# Patient Record
Sex: Female | Born: 1980 | Hispanic: No | Marital: Married | Smoking: Never smoker
Health system: Southern US, Community
[De-identification: ages and names within clinical notes are randomized; demographics above are authoritative.]

## PROBLEM LIST (undated history)

## (undated) ENCOUNTER — Inpatient Hospital Stay (HOSPITAL_COMMUNITY): Payer: Self-pay

## (undated) ENCOUNTER — Inpatient Hospital Stay (HOSPITAL_COMMUNITY): Payer: Medicaid Other

## (undated) ENCOUNTER — Emergency Department (HOSPITAL_BASED_OUTPATIENT_CLINIC_OR_DEPARTMENT_OTHER): Payer: Medicaid Other

## (undated) DIAGNOSIS — T8859XA Other complications of anesthesia, initial encounter: Secondary | ICD-10-CM

## (undated) DIAGNOSIS — K219 Gastro-esophageal reflux disease without esophagitis: Secondary | ICD-10-CM

## (undated) DIAGNOSIS — O24419 Gestational diabetes mellitus in pregnancy, unspecified control: Secondary | ICD-10-CM

## (undated) HISTORY — PX: WISDOM TOOTH EXTRACTION: SHX21

---

## 2009-01-15 ENCOUNTER — Ambulatory Visit (HOSPITAL_COMMUNITY): Admission: RE | Admit: 2009-01-15 | Discharge: 2009-01-15 | Payer: Self-pay | Admitting: Family Medicine

## 2009-02-05 ENCOUNTER — Ambulatory Visit (HOSPITAL_COMMUNITY): Admission: RE | Admit: 2009-02-05 | Discharge: 2009-02-05 | Payer: Self-pay | Admitting: Family Medicine

## 2009-02-25 ENCOUNTER — Ambulatory Visit (HOSPITAL_COMMUNITY): Admission: RE | Admit: 2009-02-25 | Discharge: 2009-02-25 | Payer: Self-pay | Admitting: Family Medicine

## 2009-07-07 ENCOUNTER — Ambulatory Visit (HOSPITAL_COMMUNITY): Admission: RE | Admit: 2009-07-07 | Discharge: 2009-07-07 | Payer: Self-pay | Admitting: Family Medicine

## 2009-07-16 ENCOUNTER — Ambulatory Visit: Payer: Self-pay | Admitting: Obstetrics and Gynecology

## 2009-07-16 ENCOUNTER — Inpatient Hospital Stay (HOSPITAL_COMMUNITY): Admission: AD | Admit: 2009-07-16 | Discharge: 2009-07-18 | Payer: Self-pay | Admitting: Family Medicine

## 2009-08-10 ENCOUNTER — Encounter: Admission: RE | Admit: 2009-08-10 | Discharge: 2009-08-10 | Payer: Self-pay | Admitting: Internal Medicine

## 2010-05-24 LAB — CBC
Hemoglobin: 12.8 g/dL (ref 12.0–15.0)
MCHC: 34.2 g/dL (ref 30.0–36.0)
RBC: 4.34 MIL/uL (ref 3.87–5.11)
WBC: 9.9 10*3/uL (ref 4.0–10.5)

## 2010-05-24 LAB — RPR: RPR Ser Ql: NONREACTIVE

## 2010-08-27 IMAGING — US US OB DETAIL+14 WK
1 series · 14 of 28 positions shown · non-contrast
Comparison: none

OBSTETRICAL ULTRASOUND:
 This ultrasound was performed in The [HOSPITAL], and the [facility] report will be stored to [REDACTED] PACS.  This report is also available in [HOSPITAL]?s accessANYware.

[Series 1: us ob detail+14 wk · 14 of 85 slices shown]
[im 4/85]
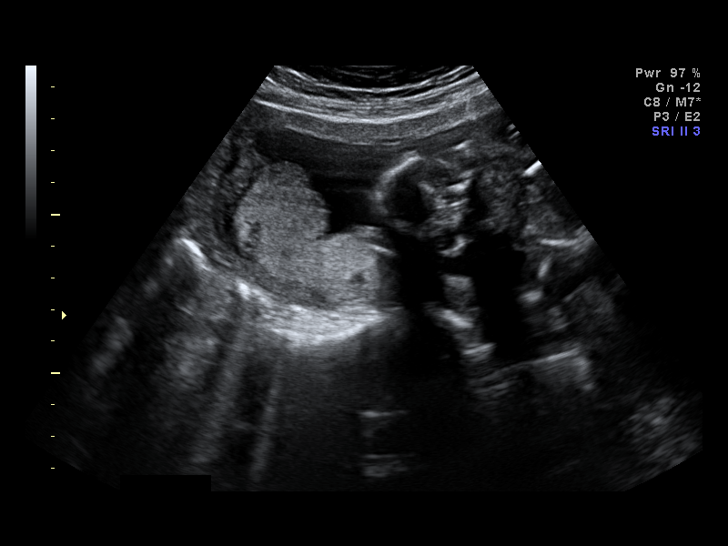
[im 10/85]
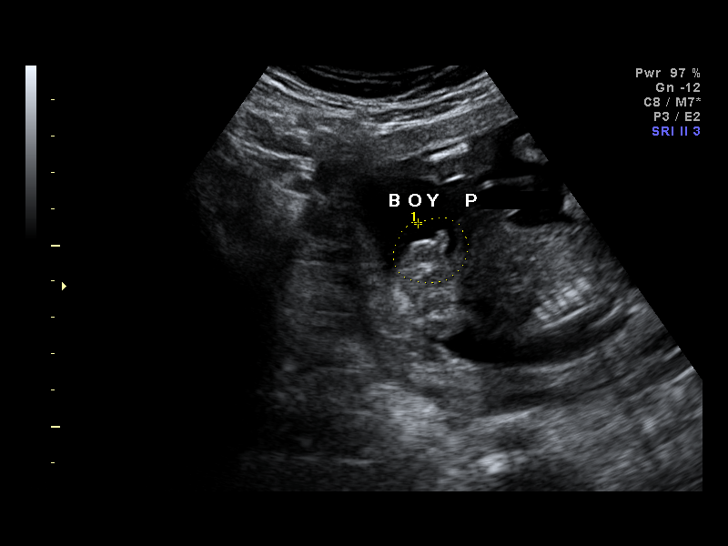
[im 16/85]
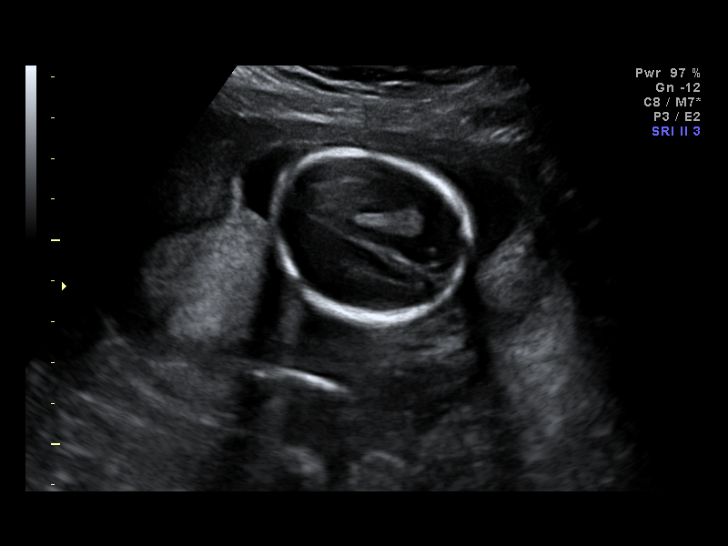
[im 22/85]
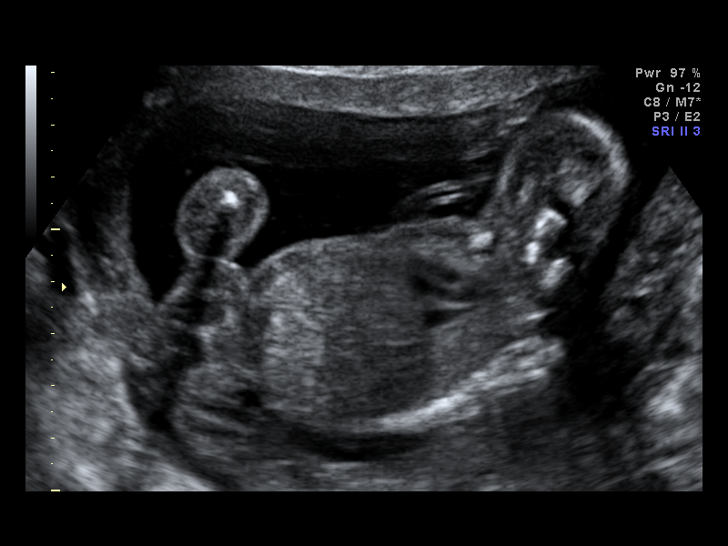
[im 29/85]
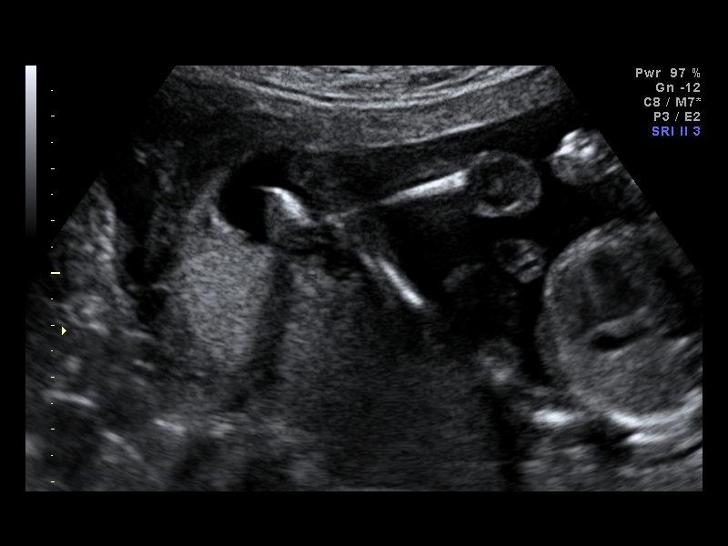
[im 35/85]
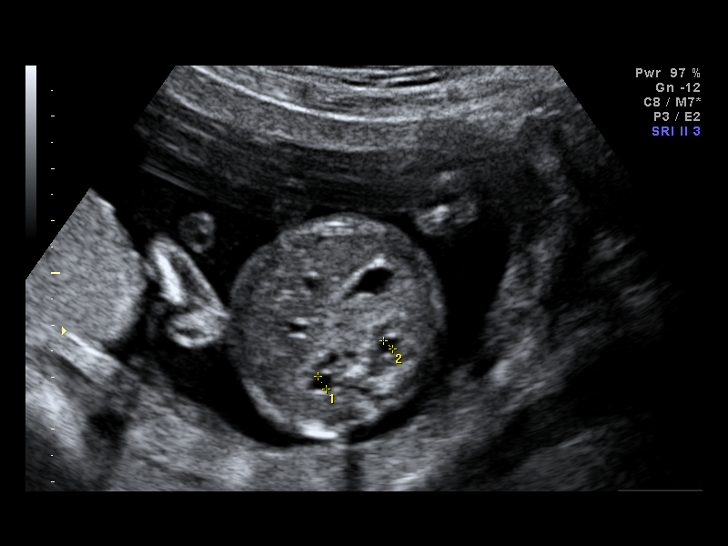
[im 41/85]
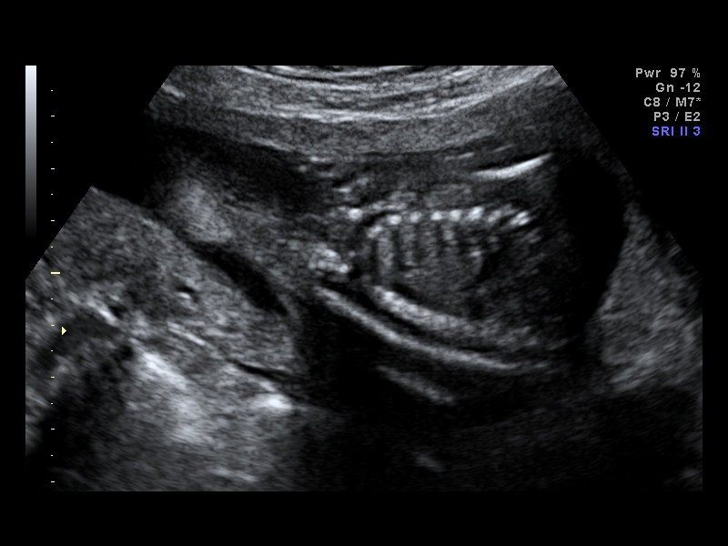
[im 47/85]
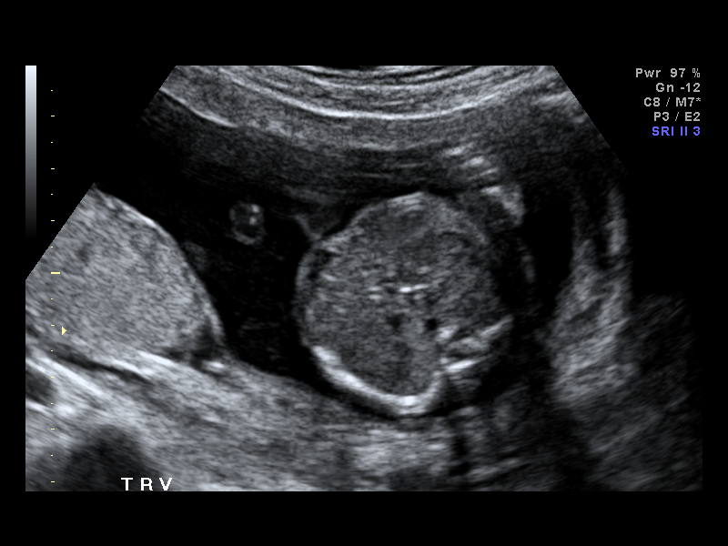
[im 53/85]
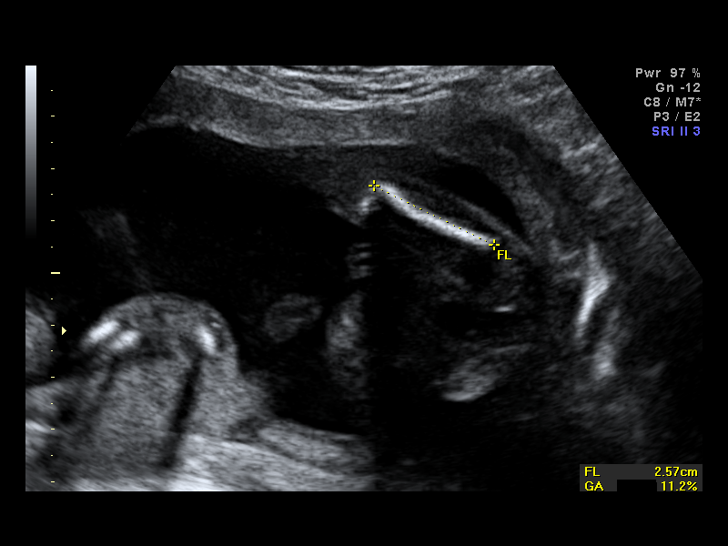
[im 60/85]
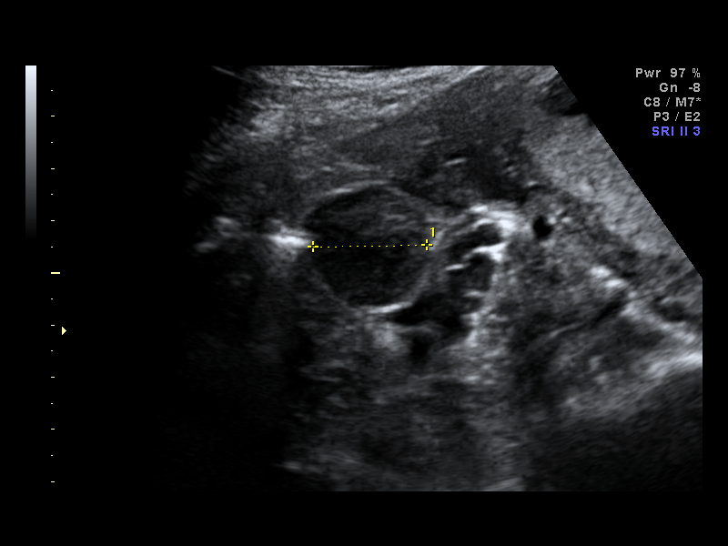
[im 66/85]
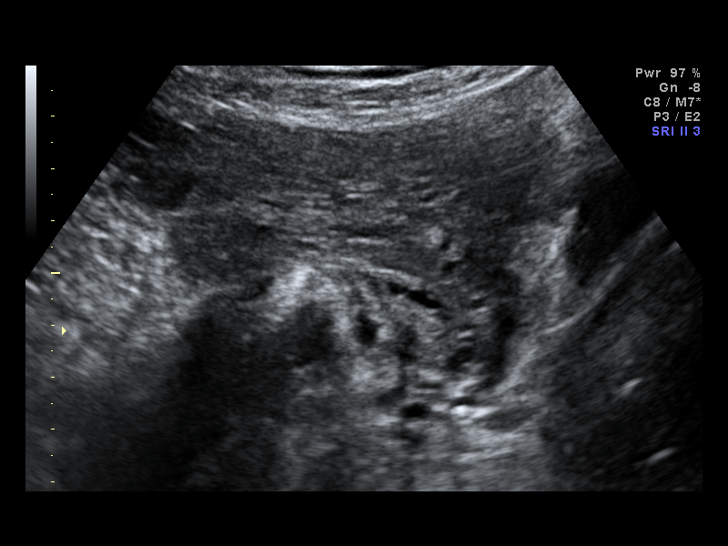
[im 72/85]
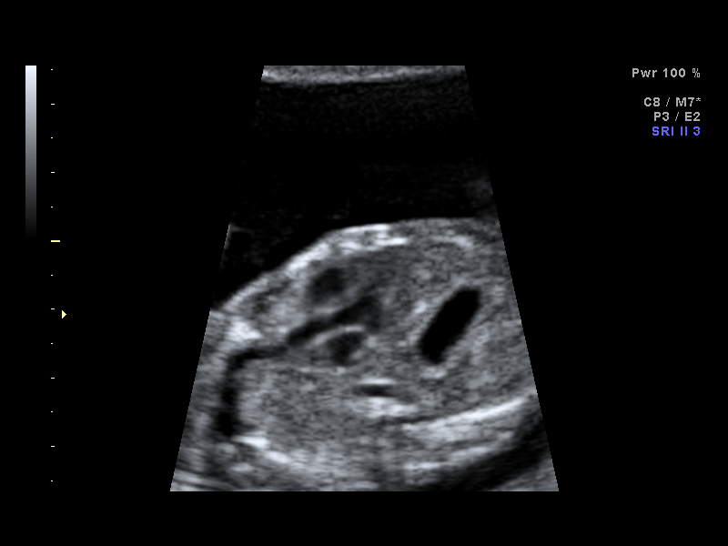
[im 78/85]
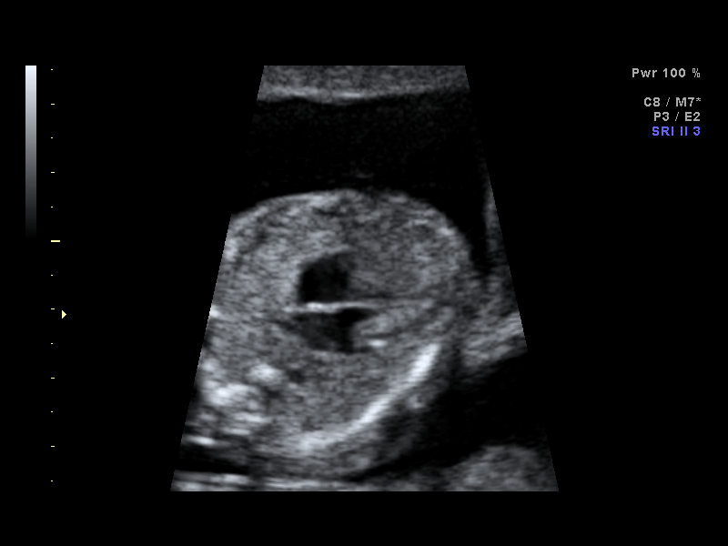
[im 85/85]
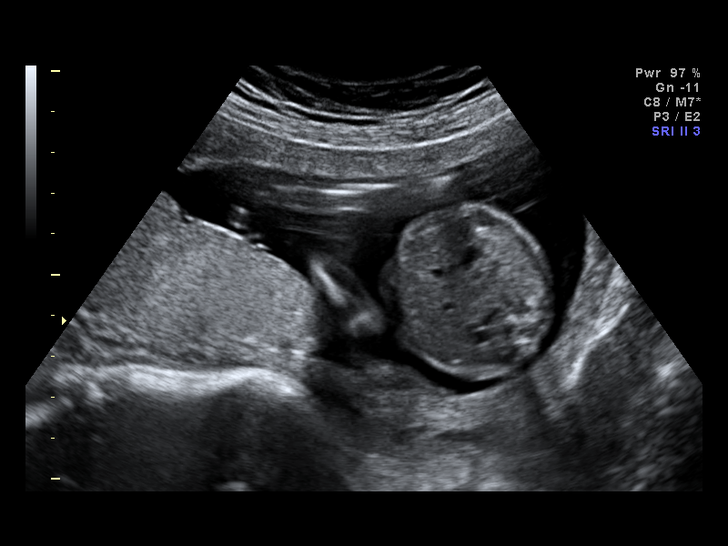

[14 of 28 positions shown; findings below may reference images not displayed]

IMPRESSION: [facility] has also been faxed to the ordering physician.

## 2011-06-17 ENCOUNTER — Ambulatory Visit (INDEPENDENT_AMBULATORY_CARE_PROVIDER_SITE_OTHER): Payer: BC Managed Care – PPO | Admitting: Physician Assistant

## 2011-06-17 VITALS — BP 113/76 | HR 80 | Temp 98.0°F | Resp 16 | Ht 62.0 in | Wt 182.6 lb

## 2011-06-17 DIAGNOSIS — R202 Paresthesia of skin: Secondary | ICD-10-CM

## 2011-06-17 DIAGNOSIS — M2550 Pain in unspecified joint: Secondary | ICD-10-CM

## 2011-06-17 DIAGNOSIS — R209 Unspecified disturbances of skin sensation: Secondary | ICD-10-CM

## 2011-06-17 LAB — POCT CBC
Granulocyte percent: 58.1 %G (ref 37–80)
HCT, POC: 38.2 % (ref 37.7–47.9)
MCV: 82.1 fL (ref 80–97)
Platelet Count, POC: 398 10*3/uL (ref 142–424)
RBC: 4.65 M/uL (ref 4.04–5.48)

## 2011-06-17 MED ORDER — NAPROXEN 500 MG PO TABS: ORAL_TABLET | ORAL | Status: DC

## 2011-06-17 MED ORDER — METAXALONE 800 MG PO TABS: ORAL_TABLET | ORAL | Status: DC

## 2011-06-17 NOTE — Progress Notes (Signed)
  Subjective:    Patient ID: Caroline Yang, female    DOB: 10/11/1980, 31 y.o.   MRN: [id number]  HPI 31 yo [place] female c/o 2 year h/o intermittent lower leg pain S/P pregnancy (son is 38 years old). Also has R hand pain and occasional numbness in fingers. This doesn't wake her from sleep. Usu takes tylenol or ibuprofen and that helps. This time concerned bc it has lasted X 5days.  (history given by her husband).  They did move 1 week ago.  She has been lifting boxes and unpacking in addition to caring for her 2 young boys.  No SOB, No f/c, no CP. No FH rheumatological disorders. Dad has gout.  Review of Systems  All other systems reviewed and are negative.      Objective:   Physical Exam  Nursing note and vitals reviewed. Constitutional: She is oriented to person, place, and time. She appears well-developed and well-nourished.  HENT:  Head: Normocephalic and atraumatic.  Neck: Normal range of motion. Neck supple. No thyromegaly present.  Cardiovascular: Normal rate, regular rhythm, normal heart sounds and intact distal pulses.  Exam reveals no gallop and no friction rub.   No murmur heard. Pulmonary/Chest: Effort normal and breath sounds normal.  Musculoskeletal: Normal range of motion. She exhibits no edema and no tenderness.       Neg homan's sign B.  No erythema of calves.  B hands examined-no rash, no edema, neg finklestein's, neg tinel's. Normal grip.  Lymphadenopathy:    She has no cervical adenopathy.  Neurological: She is alert and oriented to person, place, and time. No cranial nerve deficit.  Skin: Skin is warm and dry.       Assessment & Plan:  Intermittent Leg and Hand pain-R/O other etiology/checking labs, but most likely due to soreness from recent move and moving furniture, boxes, and packing/unpacking.

## 2011-06-18 LAB — COMPREHENSIVE METABOLIC PANEL
AST: 17 U/L (ref 0–37)
Albumin: 4.4 g/dL (ref 3.5–5.2)
BUN: 10 mg/dL (ref 6–23)
Calcium: 9.6 mg/dL (ref 8.4–10.5)
Chloride: 108 mEq/L (ref 96–112)
Glucose, Bld: 102 mg/dL — ABNORMAL HIGH (ref 70–99)
Potassium: 4 mEq/L (ref 3.5–5.3)
Sodium: 137 mEq/L (ref 135–145)
Total Protein: 7.1 g/dL (ref 6.0–8.3)

## 2011-06-21 ENCOUNTER — Encounter: Payer: Self-pay | Admitting: *Deleted

## 2011-06-21 LAB — VITAMIN D 1,25 DIHYDROXY: Vitamin D3 1, 25 (OH)2: 44 pg/mL

## 2012-04-22 ENCOUNTER — Ambulatory Visit (INDEPENDENT_AMBULATORY_CARE_PROVIDER_SITE_OTHER): Payer: BC Managed Care – PPO | Admitting: Emergency Medicine

## 2012-04-22 VITALS — BP 118/80 | HR 86 | Temp 98.6°F | Resp 12 | Ht 63.0 in | Wt 187.0 lb

## 2012-04-22 DIAGNOSIS — J209 Acute bronchitis, unspecified: Secondary | ICD-10-CM

## 2012-04-22 DIAGNOSIS — R1013 Epigastric pain: Secondary | ICD-10-CM

## 2012-04-22 DIAGNOSIS — R1033 Periumbilical pain: Secondary | ICD-10-CM

## 2012-04-22 LAB — POCT CBC
MCV: 84.2 fL (ref 80–97)
MID (cbc): 0.2 (ref 0–0.9)
POC Granulocyte: 1.2 — AB (ref 2–6.9)
POC LYMPH PERCENT: 56.8 %L — AB (ref 10–50)
Platelet Count, POC: 310 10*3/uL (ref 142–424)
RBC: 4.8 M/uL (ref 4.04–5.48)
RDW, POC: 14.3 %

## 2012-04-22 MED ORDER — LANSOPRAZOLE 30 MG PO CPDR: 30.0000 mg | DELAYED_RELEASE_CAPSULE | Freq: Every day | ORAL | Status: DC

## 2012-04-22 MED ORDER — HYDROCOD POLST-CHLORPHEN POLST 10-8 MG/5ML PO LQCR: 5.0000 mL | Freq: Two times a day (BID) | ORAL | Status: DC | PRN

## 2012-04-22 MED ORDER — AZITHROMYCIN 250 MG PO TABS: ORAL_TABLET | ORAL | Status: DC

## 2012-04-22 NOTE — Progress Notes (Signed)
  Subjective:    Patient ID: Caroline Yang, female    DOB: June 14, 1980, 32 y.o.   MRN: [id number]  HPI    Review of Systems     Objective:   Physical Exam        Assessment & Plan:   Results for orders placed in visit on 04/22/12  POCT CBC      Result Value Range   WBC 3.4 (*) 4.6 - 10.2 K/uL   Lymph, poc 1.9  0.6 - 3.4   POC LYMPH PERCENT 56.8 (*) 10 - 50 %L   MID (cbc) 0.2  0 - 0.9   POC MID % 6.7  0 - 12 %M   POC Granulocyte 1.2 (*) 2 - 6.9   Granulocyte percent 36.5 (*) 37 - 80 %G   RBC 4.80  4.04 - 5.48 M/uL   Hemoglobin 12.7  12.2 - 16.2 g/dL   HCT, POC 40.9  81.1 - 47.9 %   MCV 84.2  80 - 97 fL   MCH, POC 26.5 (*) 27 - 31.2 pg   MCHC 31.4 (*) 31.8 - 35.4 g/dL   RDW, POC 91.4     Platelet Count, POC 310  142 - 424 K/uL   MPV 8.0  0 - 99.8 fL

## 2012-04-22 NOTE — Progress Notes (Signed)
[facility] [address] [place] [address] [phone]  Date:  04/22/2012   Name:  Caroline Yang   DOB:  1980-03-30   MRN:  [id number]  PCP:  Tally Due, MD    Chief Complaint: Cough and Abdominal Pain   History of Present Illness:  Caroline Yang is a 32 y.o. very pleasant female patient who presents with the following:  Ill since Tuesday last week with a cough.  Non productive. No wheezing or shortness of breath.  No coryza.  No sore throat.  Has epigastric pain for past several days not associated with nausea or vomiting. No stool change.  No rash.  No specific food intolerance but spicy foods.  History of cholecystitis but seems  To have calmed down over the past year.  Nausea with OTC cough and cold meds only.  There is no problem list on file for this patient.   History reviewed. No pertinent past medical history.  History reviewed. No pertinent past surgical history.  History  Substance Use Topics  . Smoking status: Never Smoker   . Smokeless tobacco: Not on file  . Alcohol Use: No    History reviewed. No pertinent family history.  No Known Allergies  Medication list has been reviewed and updated.  Current Outpatient Prescriptions on File Prior to Visit  Medication Sig Dispense Refill  . metaxalone (SKELAXIN) 800 MG tablet 1 tid X 5 days then prn muscle pain  60 tablet  0  . naproxen (NAPROSYN) 500 MG tablet 1 bid X 5days then prn pain  60 tablet  0   No current facility-administered medications on file prior to visit.    Review of Systems:  As per HPI, otherwise negative.    Physical Examination: Filed Vitals:   04/22/12 1714  BP: 118/80  Pulse: 86  Temp: 98.6 F (37 C)  Resp: 12   Filed Vitals:   04/22/12 1714  Height: 5\' 3"  (1.6 m)  Weight: 187 lb (84.823 kg)   Body mass index is 33.13 kg/(m^2). Ideal Body Weight: Weight in (lb) to have BMI = 25: 140.8  GEN: WDWN, NAD, Non-toxic, A & O x 3 HEENT:  Atraumatic, Normocephalic. Neck supple. No masses, No LAD. Ears and Nose: No external deformity. CV: RRR, No M/G/R. No JVD. No thrill. No extra heart sounds. PULM: CTA B, no wheezes, crackles, rhonchi. No retractions. No resp. distress. No accessory muscle use. ABD: S, tender epigastrium, ND, +BS. No rebound. No HSM. EXTR: No c/c/e NEURO Normal gait.  PSYCH: Normally interactive. Conversant. Not depressed or anxious appearing.  Calm demeanor.    Assessment and Plan: Bronchitis Epigastric pain consider ulcer and pancreatitis Labs zpak tussionex Follow up based on labs  Carmelina Dane, MD

## 2012-04-22 NOTE — Patient Instructions (Signed)
Abdominal Pain  Abdominal pain can be caused by many things. Your caregiver decides the seriousness of your pain by an examination and possibly blood tests and X-rays. Many cases can be observed and treated at home. Most abdominal pain is not caused by a disease and will probably improve without treatment. However, in many cases, more time must pass before a clear cause of the pain can be found. Before that point, it may not be known if you need more testing, or if hospitalization or surgery is needed.  HOME CARE INSTRUCTIONS   · Do not take laxatives unless directed by your caregiver.  · Take pain medicine only as directed by your caregiver.  · Only take over-the-counter or prescription medicines for pain, discomfort, or fever as directed by your caregiver.  · Try a clear liquid diet (broth, tea, or water) for as long as directed by your caregiver. Slowly move to a bland diet as tolerated.  SEEK IMMEDIATE MEDICAL CARE IF:   · The pain does not go away.  · You have a fever.  · You keep throwing up (vomiting).  · The pain is felt only in portions of the abdomen. Pain in the right side could possibly be appendicitis. In an adult, pain in the left lower portion of the abdomen could be colitis or diverticulitis.  · You pass bloody or black tarry stools.  MAKE SURE YOU:   · Understand these instructions.  · Will watch your condition.  · Will get help right away if you are not doing well or get worse.  Document Released: 11/30/2004 Document Revised: 05/15/2011 Document Reviewed: 10/09/2007  ExitCare® Patient Information ©2013 [employer].

## 2012-04-22 NOTE — Addendum Note (Signed)
Addended by: Carmelina Dane on: 04/22/2012 05:33 PM   Modules accepted: Orders

## 2012-04-23 LAB — COMPREHENSIVE METABOLIC PANEL
AST: 23 U/L (ref 0–37)
Alkaline Phosphatase: 74 U/L (ref 39–117)
BUN: 5 mg/dL — ABNORMAL LOW (ref 6–23)
CO2: 26 mEq/L (ref 19–32)
Chloride: 102 mEq/L (ref 96–112)
Potassium: 4.1 mEq/L (ref 3.5–5.3)
Sodium: 139 mEq/L (ref 135–145)
Total Bilirubin: 0.3 mg/dL (ref 0.3–1.2)
Total Protein: 7.3 g/dL (ref 6.0–8.3)

## 2012-04-24 LAB — H. PYLORI ANTIBODY, IGG: H Pylori IgG: 5.8 {ISR} — ABNORMAL HIGH

## 2012-04-28 MED ORDER — AMOXICILL-CLARITHRO-LANSOPRAZ PO MISC: Freq: Two times a day (BID) | ORAL | Status: DC

## 2012-04-28 NOTE — Addendum Note (Signed)
Addended by: Carmelina Dane on: 04/28/2012 11:08 AM   Modules accepted: Orders

## 2012-09-11 ENCOUNTER — Other Ambulatory Visit (INDEPENDENT_AMBULATORY_CARE_PROVIDER_SITE_OTHER): Payer: Self-pay

## 2012-09-11 DIAGNOSIS — Z3201 Encounter for pregnancy test, result positive: Secondary | ICD-10-CM

## 2012-09-11 LAB — POCT URINALYSIS DIP (DEVICE)
Ketones, ur: NEGATIVE mg/dL
Specific Gravity, Urine: >=1.03 (ref 1.005–1.030)
Urobilinogen, UA: 0.2 mg/dL (ref 0.0–1.0)
pH: 5.5 (ref 5.0–8.0)

## 2012-09-12 LAB — OBSTETRIC PANEL
Antibody Screen: NEGATIVE
Basophils Absolute: 0 10*3/uL (ref 0.0–0.1)
Basophils Relative: 0 % (ref 0–1)
Eosinophils Absolute: 0.1 10*3/uL (ref 0.0–0.7)
Hemoglobin: 11.8 g/dL — ABNORMAL LOW (ref 12.0–15.0)
Hepatitis B Surface Ag: NEGATIVE
Lymphocytes Relative: 38 % (ref 12–46)
MCH: 26.3 pg (ref 26.0–34.0)
Neutrophils Relative %: 57 % (ref 43–77)
RBC: 4.49 MIL/uL (ref 3.87–5.11)
RDW: 15.6 % — ABNORMAL HIGH (ref 11.5–15.5)
Rubella: 3.03 Index — ABNORMAL HIGH (ref <0.90)

## 2012-09-13 LAB — HEMOGLOBINOPATHY EVALUATION: Hemoglobin Other: 0 %

## 2012-10-02 ENCOUNTER — Encounter: Payer: Self-pay | Admitting: Obstetrics and Gynecology

## 2012-10-02 ENCOUNTER — Other Ambulatory Visit (HOSPITAL_COMMUNITY): Payer: Self-pay

## 2012-10-02 ENCOUNTER — Ambulatory Visit (INDEPENDENT_AMBULATORY_CARE_PROVIDER_SITE_OTHER): Payer: Self-pay | Admitting: Obstetrics and Gynecology

## 2012-10-02 ENCOUNTER — Encounter: Payer: Self-pay | Admitting: Obstetrics & Gynecology

## 2012-10-02 VITALS — BP 118/69 | Temp 99.0°F | Wt 180.6 lb

## 2012-10-02 DIAGNOSIS — Z3491 Encounter for supervision of normal pregnancy, unspecified, first trimester: Secondary | ICD-10-CM

## 2012-10-02 DIAGNOSIS — O219 Vomiting of pregnancy, unspecified: Secondary | ICD-10-CM

## 2012-10-02 LAB — POCT URINALYSIS DIP (DEVICE)
Glucose, UA: NEGATIVE mg/dL
Nitrite: NEGATIVE
Urobilinogen, UA: 0.2 mg/dL (ref 0.0–1.0)

## 2012-10-02 MED ORDER — PRENATAL VIT W/ FE BISG-FA 25-1 MG PO TABS: 1.0000 | ORAL_TABLET | Freq: Every day | ORAL | Status: DC

## 2012-10-02 MED ORDER — VITAMIN B-6 25 MG PO TABS: 25.0000 mg | ORAL_TABLET | Freq: Three times a day (TID) | ORAL | Status: DC

## 2012-10-02 NOTE — Progress Notes (Signed)
P=79 

## 2012-10-02 NOTE — Progress Notes (Signed)
Subjective:    Caroline Yang is a [id number] [redacted]w[redacted]d being seen today for her first obstetrical visit.  Her obstetrical history is significant for none. Patient does intend to breast feed. Pregnancy history fully reviewed.  Patient reports headache, nausea, no bleeding, no contractions, no cramping and no leaking. Mild headache in AM, nauseated but able to keep food down.  There were no vitals filed for this visit.  HISTORY: OB History   Grav Para Term Preterm Abortions TAB SAB Ect Mult Living   3 2 2       2      # Outc Date GA Lbr Len/2nd Wgt Sex Del Anes PTL Lv   1 TRM 7/07 [redacted]w[redacted]d  6lb9.8oz(3kg) M SVD  No Yes   Comments: PPROM- labor augmented   2 TRM 5/11 [redacted]w[redacted]d  5lb8.2oz(2.5kg) M SVD  No    3 CUR              Past Medical History  Diagnosis Date  . Kidney stones 2011    after birth of 2nd child   Past Surgical History  Procedure Laterality Date  . Wisdom tooth extraction     History reviewed. No pertinent family history.   Exam    Uterus:     Pelvic Exam:    Perineum: No Hemorrhoids   Vulva: normal   Vagina:  normal mucosa   pH:    Cervix: no lesions   Adnexa: not evaluated   Bony Pelvis:   System: Breast:  Not examined   Skin: normal coloration and turgor, no rashes    Neurologic: oriented, normal, negative, normal mood   Extremities: normal strength, tone, and muscle mass, no deformities   HEENT extra ocular movement intact, sclera clear, anicteric and oropharynx clear, no lesions   Mouth/Teeth mucous membranes moist, pharynx normal without lesions   Neck supple and no masses   Cardiovascular: regular rate and rhythm, no murmurs or gallops   Respiratory:  appears well, vitals normal, no respiratory distress, acyanotic, normal RR, ear and throat exam is normal, neck free of mass or lymphadenopathy, chest clear, no wheezing, crepitations, rhonchi, normal symmetric air entry   Abdomen: mildly tender mid epigastric and suprapubic   Urinary:       Assessment:     Pregnancy: G4W1027 Patient Active Problem List   Diagnosis Date Noted  . First trimester pregnancy 10/02/2012        Plan:  Caroline Yang is a 32 y.o. G3P2002 at [redacted]w[redacted]d by LMP , here for New OB visit.   Discussed with Patient: - All new OB labs reviewed. (cbc, abo/RH,  GC/C, RPR, Rubella, HBsAg, HIV, Urine Cx) - Physiologic changes of pregnancy/ Safe meds in pregnancy/Diet modifications, [email or web address] - Routine precautions (SAB, depression, infection s/s).  Patient provided with all pertinent phone numbers for emergencies. - Review Safety measures: seat belt and proper seatbelt application - Dating [place] ordered; Patient to schedule. -  Quad screen- Pt consented for quad screen and anatomy scan - RTC in 4 weeks for follow up. - Reviewed appropriate weight gain To Do: 1. F/u PAP 2. F/u Labs  [ ]  Vaccines: Flu:   Tdap:  [ ]  BCM: not discussed  Edu: [x ] PTL precautions; [ ]  BF class; [ ]  childbirth class; [ ]   BF counseling     Initial labs drawn. Prenatal vitamins. Problem list reviewed and updated. Genetic Screening discussed Quad Screen: requested.  Ultrasound discussed; fetal survey: ordered.  Follow up in 4 weeks.Caroline Yang,  Alycia Rossetti 10/02/2012

## 2012-10-02 NOTE — Patient Instructions (Addendum)
Pregnancy - First Trimester  During sexual intercourse, millions of sperm go into the vagina. Only 1 sperm will penetrate and fertilize the female egg while it is in the Fallopian tube. One week later, the fertilized egg implants into the wall of the uterus. An embryo begins to develop into a baby. At 6 to 8 weeks, the eyes and face are formed and the heartbeat can be seen on ultrasound. At the end of 12 weeks (first trimester), all the baby's organs are formed. Now that you are pregnant, you will want to do everything you can to have a healthy baby. Two of the most important things are to get good prenatal care and follow your caregiver's instructions. Prenatal care is all the medical care you receive before the baby's birth. It is given to prevent, find, and treat problems during the pregnancy and childbirth.  PRENATAL EXAMS  · During prenatal visits, your weight, blood pressure, and urine are checked. This is done to make sure you are healthy and progressing normally during the pregnancy.  · A pregnant woman should gain 25 to 35 pounds during the pregnancy. However, if you are overweight or underweight, your caregiver will advise you regarding your weight.  · Your caregiver will ask and answer questions for you.  · Blood work, cervical cultures, other necessary tests, and a Pap test are done during your prenatal exams. These tests are done to check on your health and the probable health of your baby. Tests are strongly recommended and done for HIV with your permission. This is the virus that causes AIDS. These tests are done because medicines can be given to help prevent your baby from being born with this infection should you have been infected without knowing it. Blood work is also used to find out your blood type, previous infections, and follow your blood levels (hemoglobin).  · Low hemoglobin (anemia) is common during pregnancy. Iron and vitamins are given to help prevent this. Later in the pregnancy, blood  tests for diabetes will be done along with any other tests if any problems develop.  · You may need other tests to make sure you and the baby are doing well.  CHANGES DURING THE FIRST TRIMESTER   Your body goes through many changes during pregnancy. They vary from person to person. Talk to your caregiver about changes you notice and are concerned about. Changes can include:  · Your menstrual period stops.  · The egg and sperm carry the genes that determine what you look like. Genes from you and your partner are forming a baby. The female genes determine whether the baby is a boy or a girl.  · Your body increases in girth and you may feel bloated.  · Feeling sick to your stomach (nauseous) and throwing up (vomiting). If the vomiting is uncontrollable, call your caregiver.  · Your breasts will begin to enlarge and become tender.  · Your nipples may stick out more and become darker.  · The need to urinate more. Painful urination may mean you have a bladder infection.  · Tiring easily.  · Loss of appetite.  · Cravings for certain kinds of food.  · At first, you may gain or lose a couple of pounds.  · You may have changes in your emotions from day to day (excited to be pregnant or concerned something may go wrong with the pregnancy and baby).  · You may have more vivid and strange dreams.  HOME CARE INSTRUCTIONS   ·   It is very important to avoid all smoking, alcohol and non-prescribed drugs during your pregnancy. These affect the formation and growth of the baby. Avoid chemicals while pregnant to ensure the delivery of a healthy infant.  · Start your prenatal visits by the 12th week of pregnancy. They are usually scheduled monthly at first, then more often in the last 2 months before delivery. Keep your caregiver's appointments. Follow your caregiver's instructions regarding medicine use, blood and lab tests, exercise, and diet.  · During pregnancy, you are providing food for you and your baby. Eat regular, well-balanced  meals. Choose foods such as meat, fish, milk and other low fat dairy products, vegetables, fruits, and whole-grain breads and cereals. Your caregiver will tell you of the ideal weight gain.  · You can help morning sickness by keeping soda crackers at the bedside. Eat a couple before arising in the morning. You may want to use the crackers without salt on them.  · Eating 4 to 5 small meals rather than 3 large meals a day also may help the nausea and vomiting.  · Drinking liquids between meals instead of during meals also seems to help nausea and vomiting.  · A physical sexual relationship may be continued throughout pregnancy if there are no other problems. Problems may be early (premature) leaking of amniotic fluid from the membranes, vaginal bleeding, or belly (abdominal) pain.  · Exercise regularly if there are no restrictions. Check with your caregiver or physical therapist if you are unsure of the safety of some of your exercises. Greater weight gain will occur in the last 2 trimesters of pregnancy. Exercising will help:  · Control your weight.  · Keep you in shape.  · Prepare you for labor and delivery.  · Help you lose your pregnancy weight after you deliver your baby.  · Wear a good support or jogging bra for breast tenderness during pregnancy. This may help if worn during sleep too.  · Ask when prenatal classes are available. Begin classes when they are offered.  · Do not use hot tubs, steam rooms, or saunas.  · Wear your seat belt when driving. This protects you and your baby if you are in an accident.  · Avoid raw meat, uncooked cheese, cat litter boxes, and soil used by cats throughout the pregnancy. These carry germs that can cause birth defects in the baby.  · The first trimester is a good time to visit your dentist for your dental health. Getting your teeth cleaned is okay. Use a softer toothbrush and brush gently during pregnancy.  · Ask for help if you have financial, counseling, or nutritional needs  during pregnancy. Your caregiver will be able to offer counseling for these needs as well as refer you for other special needs.  · Do not take any medicines or herbs unless told by your caregiver.  · Inform your caregiver if there is any mental or physical domestic violence.  · Make a list of emergency phone numbers of family, friends, hospital, and police and fire departments.  · Write down your questions. Take them to your prenatal visit.  · Do not douche.  · Do not cross your legs.  · If you have to stand for long periods of time, rotate you feet or take small steps in a circle.  · You may have more vaginal secretions that may require a sanitary pad. Do not use tampons or scented sanitary pads.  MEDICINES AND DRUG USE IN PREGNANCY  ·   Take prenatal vitamins as directed. The vitamin should contain 1 milligram of folic acid. Keep all vitamins out of reach of children. Only a couple vitamins or tablets containing iron may be fatal to a baby or young child when ingested.  · Avoid use of all medicines, including herbs, over-the-counter medicines, not prescribed or suggested by your caregiver. Only take over-the-counter or prescription medicines for pain, discomfort, or fever as directed by your caregiver. Do not use aspirin, ibuprofen, or naproxen unless directed by your caregiver.  · Let your caregiver also know about herbs you may be using.  · Alcohol is related to a number of birth defects. This includes fetal alcohol syndrome. All alcohol, in any form, should be avoided completely. Smoking will cause low birth rate and premature babies.  · Street or illegal drugs are very harmful to the baby. They are absolutely forbidden. A baby born to an addicted mother will be addicted at birth. The baby will go through the same withdrawal an adult does.  · Let your caregiver know about any medicines that you have to take and for what reason you take them.  SEEK MEDICAL CARE IF:   You have any concerns or worries during your  pregnancy. It is better to call with your questions if you feel they cannot wait, rather than worry about them.  SEEK IMMEDIATE MEDICAL CARE IF:   · An unexplained oral temperature above 102° F (38.9° C) develops, or as your caregiver suggests.  · You have leaking of fluid from the vagina (birth canal). If leaking membranes are suspected, take your temperature and inform your caregiver of this when you call.  · There is vaginal spotting or bleeding. Notify your caregiver of the amount and how many pads are used.  · You develop a bad smelling vaginal discharge with a change in the color.  · You continue to feel sick to your stomach (nauseated) and have no relief from remedies suggested. You vomit blood or coffee ground-like materials.  · You lose more than 2 pounds of weight in 1 week.  · You gain more than 2 pounds of weight in 1 week and you notice swelling of your face, hands, feet, or legs.  · You gain 5 pounds or more in 1 week (even if you do not have swelling of your hands, face, legs, or feet).  · You get exposed to German measles and have never had them.  · You are exposed to fifth disease or chickenpox.  · You develop belly (abdominal) pain. Round ligament discomfort is a common non-cancerous (benign) cause of abdominal pain in pregnancy. Your caregiver still must evaluate this.  · You develop headache, fever, diarrhea, pain with urination, or shortness of breath.  · You fall or are in a car accident or have any kind of trauma.  · There is mental or physical violence in your home.  Document Released: 02/14/2001 Document Revised: 11/15/2011 Document Reviewed: 08/18/2008  ExitCare® Patient Information ©2014 [employer].

## 2012-10-11 ENCOUNTER — Emergency Department (HOSPITAL_COMMUNITY)
Admission: EM | Admit: 2012-10-11 | Discharge: 2012-10-11 | Disposition: A | Discharge status: Home / Self Care | Payer: BC Managed Care – PPO | Attending: Emergency Medicine | Admitting: Emergency Medicine

## 2012-10-11 ENCOUNTER — Encounter (HOSPITAL_COMMUNITY): Payer: Self-pay | Admitting: Emergency Medicine

## 2012-10-11 ENCOUNTER — Emergency Department (HOSPITAL_COMMUNITY): Payer: BC Managed Care – PPO

## 2012-10-11 DIAGNOSIS — Z87442 Personal history of urinary calculi: Secondary | ICD-10-CM | POA: Insufficient documentation

## 2012-10-11 DIAGNOSIS — O210 Mild hyperemesis gravidarum: Secondary | ICD-10-CM | POA: Insufficient documentation

## 2012-10-11 DIAGNOSIS — Z79899 Other long term (current) drug therapy: Secondary | ICD-10-CM | POA: Insufficient documentation

## 2012-10-11 DIAGNOSIS — R1013 Epigastric pain: Secondary | ICD-10-CM

## 2012-10-11 DIAGNOSIS — R112 Nausea with vomiting, unspecified: Secondary | ICD-10-CM

## 2012-10-11 DIAGNOSIS — R109 Unspecified abdominal pain: Secondary | ICD-10-CM | POA: Insufficient documentation

## 2012-10-11 LAB — COMPREHENSIVE METABOLIC PANEL
ALT: 51 U/L — ABNORMAL HIGH (ref 0–35)
BUN: 5 mg/dL — ABNORMAL LOW (ref 6–23)
CO2: 21 mEq/L (ref 19–32)
Calcium: 9.5 mg/dL (ref 8.4–10.5)
Chloride: 101 mEq/L (ref 96–112)
Creatinine, Ser: 0.39 mg/dL — ABNORMAL LOW (ref 0.50–1.10)
GFR calc Af Amer: >90 mL/min (ref >90)
Sodium: 135 mEq/L (ref 135–145)
Total Protein: 7.4 g/dL (ref 6.0–8.3)

## 2012-10-11 LAB — CBC WITH DIFFERENTIAL/PLATELET
Basophils Absolute: 0 10*3/uL (ref 0.0–0.1)
Eosinophils Relative: 0 % (ref 0–5)
HCT: 35.4 % — ABNORMAL LOW (ref 36.0–46.0)
Hemoglobin: 12.1 g/dL (ref 12.0–15.0)
Lymphocytes Relative: 19 % (ref 12–46)
MCH: 27.4 pg (ref 26.0–34.0)
MCHC: 34.2 g/dL (ref 30.0–36.0)
Monocytes Relative: 4 % (ref 3–12)
Neutrophils Relative %: 77 % (ref 43–77)
WBC: 9.1 10*3/uL (ref 4.0–10.5)

## 2012-10-11 MED ORDER — ONDANSETRON 4 MG PO TBDP: 4.0000 mg | ORAL_TABLET | Freq: Four times a day (QID) | ORAL | Status: DC | PRN

## 2012-10-11 MED ORDER — ALUM & MAG HYDROXIDE-SIMETH 200-200-20 MG/5ML PO SUSP
30.0000 mL | Freq: Once | ORAL | Status: AC
  Administered 2012-10-11: 30 mL via ORAL
  Filled 2012-10-11: qty 30

## 2012-10-11 MED ORDER — ONDANSETRON 4 MG PO TBDP
4.0000 mg | ORAL_TABLET | Freq: Once | ORAL | Status: AC
  Administered 2012-10-11: 4 mg via ORAL
  Filled 2012-10-11: qty 1

## 2012-10-11 NOTE — ED Provider Notes (Signed)
CSN: [id number]     Arrival date & time 10/11/12  0251 History     First MD Initiated Contact with Patient 10/11/12 [id number]     Chief Complaint  Patient presents with  . Emesis During Pregnancy  . Abdominal Pain   HPI Caroline Yang is a 32 y.o. female is a history of gallstones, she did not followup with the surgery after she was diagnosed with a gallstone and had some similar "gallstone attacks" in June. Patient presents, she is currently [redacted] weeks pregnant and started having upper abdominal pain described as" pain", associated nausea and vomiting. She's had no other symptoms. She is a fevers, chills, diarrhea, chest pain, shortness of breath. She's not taken anything for it. Symptoms have been severe, gotten better since she's been in the ER.   Past Medical History  Diagnosis Date  . Kidney stones 2011    after birth of 2nd child   Past Surgical History  Procedure Laterality Date  . Wisdom tooth extraction     No family history on file. History  Substance Use Topics  . Smoking status: Never Smoker   . Smokeless tobacco: Not on file  . Alcohol Use: No   OB History   Grav Para Term Preterm Abortions TAB SAB Ect Mult Living   3 2 2       2      Review of Systems At least 10pt or greater review of systems completed and are negative except where specified in the HPI.  Allergies  Review of patient's allergies indicates no known allergies.  Home Medications   Current Outpatient Rx  Name  Route  Sig  Dispense  Refill  . Prenatal Vit w/ Fe Bisg-FA 25-1 MG TABS   Oral   Take 1 tablet by mouth daily.   90 each   3   . ondansetron (ZOFRAN ODT) 4 MG disintegrating tablet   Oral   Take 1 tablet (4 mg total) by mouth every 6 (six) hours as needed for nausea.   10 tablet   0    BP 111/62  Pulse 66  Temp(Src) 97.9 F (36.6 C) (Oral)  Resp 16  SpO2 100%  LMP 08/02/2012 Physical Exam  Nursing notes reviewed.  Electronic medical record reviewed. VITAL SIGNS:   Filed  Vitals:   10/11/12 0253 10/11/12 0617 10/11/12 0725  BP: 121/64 97/53 111/62  Pulse:  69 66  Temp: 97.9 F (36.6 C)    TempSrc: Oral    Resp: 18 27 16   SpO2: 99% 100% 100%   CONSTITUTIONAL: Awake, oriented, appears non-toxic HENT: Atraumatic, normocephalic, oral mucosa pink and moist, airway patent. Nares patent without drainage. External ears normal. EYES: Conjunctiva clear, EOMI, PERRLA NECK: Trachea midline, non-tender, supple CARDIOVASCULAR: Normal heart rate, Normal rhythm, No murmurs, rubs, gallops PULMONARY/CHEST: Clear to auscultation, no rhonchi, wheezes, or rales. Symmetrical breath sounds. Non-tender. ABDOMINAL: Non-distended, soft, non-tender - no rebound or guarding.  BS normal. NEUROLOGIC: Non-focal, moving all four extremities, no gross sensory or motor deficits. EXTREMITIES: No clubbing, cyanosis, or edema SKIN: Warm, Dry, No erythema, No rash  ED Course   Procedures (including critical care time)  Labs Reviewed  CBC WITH DIFFERENTIAL - Abnormal; Notable for the following:    HCT 35.4 (*)    All other components within normal limits  COMPREHENSIVE METABOLIC PANEL - Abnormal; Notable for the following:    Potassium 3.2 (*)    Glucose, Bld 104 (*)    BUN 5 (*)  Creatinine, Ser 0.39 (*)    AST 99 (*)    ALT 51 (*)    All other components within normal limits  LIPASE, BLOOD   US Abdomen Complete  10/11/2012   *RADIOLOGY REPORT*  Clinical Data:  Abdominal pain.  History of gallstones.  COMPLETE ABDOMINAL ULTRASOUND  Comparison:  08/10/2009.  Findings:  Gallbladder:  Biliary sludge is present.  There are no discrete stones identified.  There is no wall thickening or pericholecystic fluid.  No sonographic Murphy's sign.  Common bile duct:  3 mm, normal.  Liver:  No focal lesion identified.  Within normal limits in parenchymal echogenicity.  IVC:  Appears normal.  Pancreas:  Suboptimally visualized due to overlying bowel gas.  Spleen:  6.6 cm.  Normal echotexture.   Right Kidney:  12.3 cm. Normal echotexture.  Normal central sinus echo complex.  No calculi or hydronephrosis.  Left Kidney:  12.5 cm. Normal echotexture.  Normal central sinus echo complex.  No calculi or hydronephrosis.  Abdominal aorta:  No aneurysm identified.  IMPRESSION: Biliary sludge without stones.  Compared to the prior exam, the previously seen sub centimeter stone has probably passed.  No common duct dilation or common duct stone.  No acute abnormality on today's exam.   Original Report Authenticated By: Andreas Newport, M.D.   1. Epigastric pain   2. Nausea and vomiting     MDM  Bedside ultrasound revealed no stones in the gallbladder, formal study ordered, there is some sludge in the gallbladder, there is no evidence for obstruction, stone not seen in the duct, no evidence for biliary obstruction, minimal elevation in AST ALT a do not think this is significant this time. Patient's white count is within normal limits, patient is afebrile, nontoxic with vital signs stable and within normal limits. Patient received Maalox and Zofran with complete resolution of symptoms. She's able to tolerate by mouth food and fluids. Do not think this patient has an intra-abdominal emergency at this time. By the patient followup with her OB/GYN, given prescription for Zofran, patient to return to the ER for any concerning or worsening symptoms, patient's husband understand and accept the medical plan as it's been dictated and I have answered their questions to  Jones Skene, MD 10/11/12 [phone]

## 2012-10-11 NOTE — ED Notes (Signed)
PT. REPORTS UPPER ABDOMINAL PAIN WITH NAUSEA AND VOMITTING ONSET LAST NIGHT , PT. IS [redacted] WEEKS PREGNANT , NO VAGINAL BLEEDING , DENIES FEVER OR CHILLS. NO DIARRHEA.

## 2012-10-11 NOTE — ED Notes (Signed)
Pt given saltine crackers and ginger ale 

## 2012-10-28 ENCOUNTER — Other Ambulatory Visit: Payer: Self-pay | Admitting: Family Medicine

## 2012-10-28 DIAGNOSIS — Z3682 Encounter for antenatal screening for nuchal translucency: Secondary | ICD-10-CM

## 2012-10-29 ENCOUNTER — Ambulatory Visit (HOSPITAL_COMMUNITY)
Admission: RE | Admit: 2012-10-29 | Discharge: 2012-10-29 | Disposition: A | Discharge status: Home / Self Care | Payer: BC Managed Care – PPO | Source: Ambulatory Visit | Attending: Advanced Practice Midwife | Admitting: Advanced Practice Midwife

## 2012-10-29 VITALS — BP 142/79 | HR 100 | Wt 174.0 lb

## 2012-10-29 DIAGNOSIS — Z3689 Encounter for other specified antenatal screening: Secondary | ICD-10-CM | POA: Insufficient documentation

## 2012-10-29 DIAGNOSIS — Z3491 Encounter for supervision of normal pregnancy, unspecified, first trimester: Secondary | ICD-10-CM

## 2012-10-29 DIAGNOSIS — O351XX0 Maternal care for (suspected) chromosomal abnormality in fetus, not applicable or unspecified: Secondary | ICD-10-CM | POA: Insufficient documentation

## 2012-10-29 DIAGNOSIS — Z3682 Encounter for antenatal screening for nuchal translucency: Secondary | ICD-10-CM

## 2012-10-29 DIAGNOSIS — O3510X0 Maternal care for (suspected) chromosomal abnormality in fetus, unspecified, not applicable or unspecified: Secondary | ICD-10-CM | POA: Insufficient documentation

## 2012-10-29 NOTE — Progress Notes (Signed)
Caroline Yang  was seen today for an ultrasound appointment.  See full report in [facility].  Impression: Single IUP at 12 4/7 weeks Normal NT (1.5 mm) Nasal bone was visualized. First trimester aneuploidy screen performed as noted above.  Left, fundal uterine myoma noted as above.  Recommendations: Please do not draw triple/quad screen, though patient should be offered MSAFP for neural tube defect screening.  Recommend ultrasound for fetal anatomy at approx 18 weeks.  Alpha Gula, MD

## 2012-10-30 ENCOUNTER — Ambulatory Visit (INDEPENDENT_AMBULATORY_CARE_PROVIDER_SITE_OTHER): Payer: BC Managed Care – PPO | Admitting: Advanced Practice Midwife

## 2012-10-30 ENCOUNTER — Encounter: Payer: Self-pay | Admitting: Advanced Practice Midwife

## 2012-10-30 VITALS — BP 103/74 | Temp 98.0°F | Wt 172.0 lb

## 2012-10-30 DIAGNOSIS — K219 Gastro-esophageal reflux disease without esophagitis: Secondary | ICD-10-CM

## 2012-10-30 DIAGNOSIS — Z3491 Encounter for supervision of normal pregnancy, unspecified, first trimester: Secondary | ICD-10-CM

## 2012-10-30 DIAGNOSIS — Z331 Pregnant state, incidental: Secondary | ICD-10-CM

## 2012-10-30 LAB — POCT URINALYSIS DIP (DEVICE)
Nitrite: NEGATIVE
Protein, ur: 30 mg/dL — AB
Specific Gravity, Urine: >=1.03 (ref 1.005–1.030)
Urobilinogen, UA: 0.2 mg/dL (ref 0.0–1.0)

## 2012-10-30 MED ORDER — PANTOPRAZOLE SODIUM 20 MG PO TBEC: 20.0000 mg | DELAYED_RELEASE_TABLET | Freq: Every day | ORAL | Status: DC

## 2012-10-30 NOTE — Patient Instructions (Addendum)
Heartburn During Pregnancy   Heartburn is a burning sensation in the chest caused by stomach acid backing up into the esophagus. Heartburn (also known as "reflux") is common in pregnancy because a certain hormone (progesterone) changes. The progesterone hormone may relax the valve that separates the esophagus from the stomach. This allows acid to go up into the esophagus, causing heartburn. Heartburn may also happen in pregnancy because the enlarging uterus pushes up on the stomach, which pushes more acid into the esophagus. This is especially true in the later stages of pregnancy. Heartburn problems usually go away after giving birth.  CAUSES   · The progesterone hormone.  · Changing hormone levels.  · The growing uterus that pushes stomach acid upward.  · Large meals.  · Certain foods and drinks.  · Exercise.  · Increased acid production.  SYMPTOMS   · Burning pain in the chest or lower throat.  · Bitter taste in the mouth.  · Coughing.  DIAGNOSIS   Heartburn is typically diagnosed by your caregiver when taking a careful history of your concern. Your caregiver may order a blood test to check for a certain type of bacteria that is associated with heartburn. Sometimes, heartburn is diagnosed by prescribing a heartburn medicine to see if the symptoms improve. It is rare in pregnancy to have a procedure called an endoscopy. This is when a tube with a light and a camera on the end is used to examine the esophagus and the stomach.  TREATMENT   · Your caregiver may tell you to use certain over-the-counter medicines (antacids, acid reducers) for mild heartburn.  · Your caregiver may prescribe medicines to decrease stomach acid or to protect your stomach lining.  · Your caregiver may recommend certain diet changes.  · For severe cases, your caregiver may recommend that the head of the bed be elevated on blocks. (Sleeping with more pillows is not an effective treatment as it only changes the position of your head and does  not improve the main problem of stomach acid refluxing into the esophagus.)  HOME CARE INSTRUCTIONS   · Take all medicines as directed by your caregiver.  · Raise the head of your bed by putting blocks under the legs if instructed to by your caregiver.  · Do not exercise right after eating.  · Avoid eating 2 or 3 hours before bed. Do not lie down right after eating.  · Eat small meals throughout the day instead of 3 large meals.  · Identify foods and beverages that make your symptoms worse and avoid them. Foods you may want to avoid include:  · Peppers.  · Chocolate.  · High-fat foods, including fried foods.  · Spicy foods.  · Garlic and onions.  · Citrus fruits, including oranges, grapefruit, lemons, and limes.  · Food containing tomatoes or tomato products.  · Mint.  · Carbonated and caffeinated drinks.  · Vinegar.  SEEK IMMEDIATE MEDICAL CARE IF:   · You have severe chest pain that goes down your arm or into your jaw or neck.  · You feel sweaty, dizzy, or lightheaded.  · You become short of breath.  · You vomit blood.  · You have difficulty or pain with swallowing.  · You have bloody or black, tarry stools.  · You have episodes of heartburn more than 3 times a week, for more than 2 weeks.  MAKE SURE YOU:  · Understand these instructions.  · Will watch your condition.  · Will get 

## 2012-10-30 NOTE — Progress Notes (Signed)
Pulse: 92

## 2012-10-30 NOTE — Progress Notes (Signed)
Still having lots of acid reflux, using OTC antacid. Has zofran but no PPI. Will order Protonix

## 2012-11-01 ENCOUNTER — Encounter: Payer: Self-pay | Admitting: *Deleted

## 2012-11-01 DIAGNOSIS — Z3491 Encounter for supervision of normal pregnancy, unspecified, first trimester: Secondary | ICD-10-CM

## 2012-11-02 ENCOUNTER — Encounter: Payer: Self-pay | Admitting: Advanced Practice Midwife

## 2012-11-05 ENCOUNTER — Telehealth: Payer: Self-pay

## 2012-11-05 DIAGNOSIS — O289 Unspecified abnormal findings on antenatal screening of mother: Secondary | ICD-10-CM

## 2012-11-05 NOTE — Telephone Encounter (Signed)
Scheduled Genetic Counseling appt for September 11th @ 1pm.  Called pt with [facility] interpreter # [phone] and informed pt's husband that she has an appt scheduled for the above date and time at MFM located on 1st floor of hospital.  If pt has questions to please give [place] a call cause its concerning to inform her results or they can inform her at the appt.

## 2012-11-14 ENCOUNTER — Ambulatory Visit (HOSPITAL_COMMUNITY)
Admission: RE | Admit: 2012-11-14 | Discharge: 2012-11-14 | Disposition: A | Discharge status: Home / Self Care | Payer: BC Managed Care – PPO | Source: Ambulatory Visit | Attending: Advanced Practice Midwife | Admitting: Advanced Practice Midwife

## 2012-11-14 NOTE — Progress Notes (Signed)
Genetic Counseling  High-Risk Gestation Note  Appointment Date:  11/14/2012 Referred By: Aviva Signs, CNM Date of Birth:  1980-05-17     Pregnancy History: [id number] Estimated Date of Delivery: 05/09/13 Estimated Gestational Age: [redacted]w[redacted]d Attending: Particia Nearing, MD   [name] was seen for genetic counseling because of an increased risk for fetal Down syndrome based on first trimester screening performed through [facility]. Caroline Yang from [employer] provided medical interpretation for today's visit.   She was counseled regarding the First trimester screen result and the associated 1 in 39 risk for fetal Down syndrome.  We reviewed chromosomes, nondisjunction, and the common features and variable prognosis of Down syndrome.  In addition, we reviewed the screen adjusted reduction in risks for trisomy 18/13 (1 in 755 to 1 in 4,520).   We also discussed other explanations for a screen positive result including: differences in maternal metabolism and normal variation. They understand that this screening is not diagnostic for Down syndrome but provides a risk assessment. We specifically discussed that the level of one of the proteins analyzed on the screen, PAPP-A, was very low (0.1%tile).  This has been associated with an increased risk for growth restriction or poor pregnancy outcome later in pregnancy; therefore, we would recommend offering a follow up ultrasound for fetal growth in the third trimester.  We reviewed available screening options including noninvasive prenatal screening (NIPS)/cell free fetal DNA (cffDNA) testing and detailed ultrasound.  She was counseled that screening tests are used to modify a patient's a priori risk for aneuploidy, typically based on age. This estimate provides a pregnancy specific risk assessment. We reviewed the benefits and limitations of each option. Specifically, we discussed the conditions for which each test  screens, the detection rates, and false positive rates of each. She was also counseled regarding diagnostic testing via amniocentesis. We reviewed the approximate 1 in 300-500 risk for complications for amniocentesis, including spontaneous pregnancy loss. After careful consideration, the patient declined NIPS and amniocentesis at the time of today's visit.   The patient also expressed interest in having a detailed ultrasound.  This was scheduled for 12/10/12. She stated that pending results of ultrasound she would possibly consider further testing at that time. She understands that screening tests cannot rule out all birth defects or genetic syndromes. The patient was advised of this limitation and states she still does not want additional testing at this time.   [name] was provided with written information regarding sickle cell anemia (SCA) including the carrier frequency and incidence in the African population, the availability of carrier testing and prenatal diagnosis if indicated.  In addition, we discussed that hemoglobinopathies are routinely screened for as part of the [employer] newborn screening panel.  She previously had hemoglobin electrophoresis performed through her OB office, which revealed the presence of normal adult hemoglobin. Thus, she does not appear to have sickle cell trait or other hemoglobin variant.    Both family histories were reviewed and found to be noncontributory for birth defects, intellectual disability, recurrent pregnancy loss, or known genetic conditions. The patient denied consanguinity to her partner.  Without further information regarding the provided family history, an accurate genetic risk cannot be calculated. Further genetic counseling is warranted if more information is obtained.  Ms. Hetzer denied exposure to environmental toxins or chemical agents. She denied the use of alcohol, tobacco or street drugs. She denied significant viral illnesses during the  course of her pregnancy. Her medical and surgical  histories were noncontributory.   I counseled [name] for approximately 40 minutes regarding the above risks and available options.   Caroline Plowman, MS,  Certified Genetic Counselor 11/14/2012

## 2012-11-27 ENCOUNTER — Other Ambulatory Visit: Payer: Self-pay | Admitting: Advanced Practice Midwife

## 2012-11-27 ENCOUNTER — Ambulatory Visit (INDEPENDENT_AMBULATORY_CARE_PROVIDER_SITE_OTHER): Payer: BC Managed Care – PPO | Admitting: Advanced Practice Midwife

## 2012-11-27 ENCOUNTER — Ambulatory Visit (HOSPITAL_COMMUNITY)
Admission: RE | Admit: 2012-11-27 | Discharge: 2012-11-27 | Disposition: A | Discharge status: Home / Self Care | Payer: BC Managed Care – PPO | Source: Ambulatory Visit | Attending: Advanced Practice Midwife | Admitting: Advanced Practice Midwife

## 2012-11-27 ENCOUNTER — Encounter: Payer: Self-pay | Admitting: Advanced Practice Midwife

## 2012-11-27 VITALS — BP 121/77 | Temp 97.5°F | Wt 174.7 lb

## 2012-11-27 DIAGNOSIS — Z3689 Encounter for other specified antenatal screening: Secondary | ICD-10-CM | POA: Insufficient documentation

## 2012-11-27 DIAGNOSIS — Z3492 Encounter for supervision of normal pregnancy, unspecified, second trimester: Secondary | ICD-10-CM

## 2012-11-27 DIAGNOSIS — O021 Missed abortion: Secondary | ICD-10-CM

## 2012-11-27 DIAGNOSIS — Z3491 Encounter for supervision of normal pregnancy, unspecified, first trimester: Secondary | ICD-10-CM

## 2012-11-27 DIAGNOSIS — O368390 Maternal care for abnormalities of the fetal heart rate or rhythm, unspecified trimester, not applicable or unspecified: Secondary | ICD-10-CM | POA: Insufficient documentation

## 2012-11-27 LAB — POCT URINALYSIS DIP (DEVICE)
Glucose, UA: NEGATIVE mg/dL
Hgb urine dipstick: NEGATIVE
Nitrite: NEGATIVE
Urobilinogen, UA: 0.2 mg/dL (ref 0.0–1.0)
pH: 6.5 (ref 5.0–8.0)

## 2012-11-27 NOTE — Patient Instructions (Signed)
Miscarriage  An early pregnancy loss or spontaneous abortion (miscarriage) is a common problem. This usually happens when the pregnancy is not developing normally. It is very unlikely that you or your partner did anything to cause this, although cigarette smoking, a sexually transmitted disease, excessive alcohol use, or drug abuse can increase the risk. Other causes are:   Abnormalities of the uterus.   Hormone or medical problems.   Trauma or genetic (chromosome) problems.  Having a miscarriage does not change your chances of having a normal pregnancy in the future. Your caregiver will advise you when it is safe to try to get pregnant again.  AFTER A MISCARRIAGE   A miscarriage is inevitable when there is continual, heavy vaginal bleeding; cramping; dilation of the cervix; or passing of any pregnancy tissue. Bleeding and cramping will usually continue until all the tissue has been removed from the womb (uterus).   Often the uterus does not clean itself out completely. A medication or a D&C procedure is needed to loosen or remove the pregnancy tissue from the uterus. A D&C scrapes or suctions the tissue out.   If you are RH negative, you may need to have Rh immune globulin to avoid Rh problems.   You may be given medication to fight an infection if the miscarriage was due to an infection.  HOME CARE INSTRUCTIONS    You should rest in bed for the next 2 to 3 days.   Do not take tub baths or put anything in your vagina, including tampons or a douche.   Do not have sex until your caregiver approves.   Avoid exercise or heavy activities until directed by your caregiver.   Save any vaginal discharge that looks like tissue. Ask your caregiver if he or she wants to inspect the discharge.   If you and your partner are having problems with guilt or grieving, talk to your caregiver or get counseling to help you understand and cope with your pregnancy loss.   Allow enough time to grieve before trying to get  pregnant again.  SEEK IMMEDIATE MEDICAL CARE IF:    You have persistent heavy bleeding or a bad smelling vaginal discharge.   You have continued abdominal or pelvic pain.   You have an oral temperature above 102 F (38.9 C), not controlled by medicine.   You have severe weakness, fainting, or keep throwing up (vomiting).   You develop chills.   You are experiencing domestic violence.  MAKE SURE YOU:    Understand these instructions.   Will watch your condition.   Will get help right away if you are not doing well or get worse.  Document Released: 03/30/2004 Document Revised: 02/09/2011 Document Reviewed: 02/13/2008  ExitCare Patient Information 2012 [employer].

## 2012-11-27 NOTE — Progress Notes (Signed)
Informal [place] for FHR = no cardiac activity seen.  Pt sent to [place] dept for definitive status of viability

## 2012-11-27 NOTE — Progress Notes (Signed)
Pulse- 78 Patient states she has felt fetal movement but this week has been less than last week; reports occasional dizziness

## 2012-11-27 NOTE — Progress Notes (Signed)
Patient desires surgical management with suction dilation suction and curretage.  The risks of surgery were discussed in detail with the patient including but not limited to: bleeding which may require transfusion or reoperation; infection which may require prolonged hospitalization or re-hospitalization and antibiotic therapy; injury to bowel, bladder, ureters and major vessels or other surrounding organs; need for additional procedures including laparotomy; thromboembolic phenomenon, incisional problems and other postoperative or anesthesia complications.  Patient was told that the likelihood that her condition and symptoms will be treated effectively with this surgical management was very high; the postoperative expectations were also discussed in detail. The patient also understands the alternative treatment options which were discussed in full. All questions were answered. She will f/u in am for surgery.

## 2012-11-27 NOTE — Progress Notes (Signed)
Unable to hear FHTs.  Seen by MFM on 9/11, declined further genetic testing. Will check with [place]  >>  Confirms IUFD, approximately 12-13 weeks.  Discussed with patient and interpretor. She expresses understanding. Explained we recommend D&C for treatment. Wants to start OCPs as soon as possible. Dr Erin Fulling in to discuss surgery with patient.

## 2012-11-28 ENCOUNTER — Encounter (HOSPITAL_COMMUNITY): Payer: Self-pay | Admitting: *Deleted

## 2012-11-28 ENCOUNTER — Encounter (HOSPITAL_COMMUNITY): Payer: Self-pay | Admitting: Pharmacy Technician

## 2012-11-28 ENCOUNTER — Encounter (HOSPITAL_COMMUNITY)
Admission: RE | Disposition: A | Discharge status: Home / Self Care | Payer: Self-pay | Source: Ambulatory Visit | Attending: Obstetrics & Gynecology

## 2012-11-28 ENCOUNTER — Ambulatory Visit (HOSPITAL_COMMUNITY): Payer: BC Managed Care – PPO

## 2012-11-28 ENCOUNTER — Ambulatory Visit (HOSPITAL_COMMUNITY)
Admission: RE | Admit: 2012-11-28 | Discharge: 2012-11-28 | Disposition: A | Discharge status: Home / Self Care | Payer: BC Managed Care – PPO | Source: Ambulatory Visit | Attending: Obstetrics & Gynecology | Admitting: Obstetrics & Gynecology

## 2012-11-28 ENCOUNTER — Encounter (HOSPITAL_COMMUNITY): Payer: Self-pay | Admitting: Anesthesiology

## 2012-11-28 ENCOUNTER — Ambulatory Visit (HOSPITAL_COMMUNITY): Payer: BC Managed Care – PPO | Admitting: Anesthesiology

## 2012-11-28 DIAGNOSIS — O021 Missed abortion: Secondary | ICD-10-CM | POA: Insufficient documentation

## 2012-11-28 DIAGNOSIS — Z3491 Encounter for supervision of normal pregnancy, unspecified, first trimester: Secondary | ICD-10-CM

## 2012-11-28 HISTORY — PX: DILATION AND EVACUATION: SHX1459

## 2012-11-28 LAB — CBC
MCH: 27.7 pg (ref 26.0–34.0)
MCHC: 34.8 g/dL (ref 30.0–36.0)
Platelets: 327 10*3/uL (ref 150–400)

## 2012-11-28 SURGERY — DILATION AND EVACUATION, UTERUS
Anesthesia: Monitor Anesthesia Care | Site: Vagina | Wound class: Clean Contaminated

## 2012-11-28 MED ORDER — MIDAZOLAM HCL 2 MG/2ML IJ SOLN: 0.5000 mg | Freq: Once | INTRAMUSCULAR | Status: DC | PRN

## 2012-11-28 MED ORDER — ONDANSETRON HCL 4 MG/2ML IJ SOLN
INTRAMUSCULAR | Status: DC | PRN
  Administered 2012-11-28: 4 mg via INTRAVENOUS

## 2012-11-28 MED ORDER — METHYLERGONOVINE MALEATE 0.2 MG/ML IJ SOLN
INTRAMUSCULAR | Status: DC | PRN
  Administered 2012-11-28: 0.2 mg via INTRAMUSCULAR

## 2012-11-28 MED ORDER — LIDOCAINE HCL (CARDIAC) 20 MG/ML IV SOLN
INTRAVENOUS | Status: DC | PRN
  Administered 2012-11-28: 70 mg via INTRAVENOUS

## 2012-11-28 MED ORDER — IBUPROFEN 600 MG PO TABS: 600.0000 mg | ORAL_TABLET | Freq: Four times a day (QID) | ORAL | Status: DC | PRN

## 2012-11-28 MED ORDER — MEPERIDINE HCL 25 MG/ML IJ SOLN: 6.2500 mg | INTRAMUSCULAR | Status: DC | PRN

## 2012-11-28 MED ORDER — BUPIVACAINE HCL (PF) 0.5 % IJ SOLN
INTRAMUSCULAR | Status: DC | PRN
  Administered 2012-11-28: 20 mL

## 2012-11-28 MED ORDER — LIDOCAINE HCL (CARDIAC) 20 MG/ML IV SOLN
INTRAVENOUS | Status: AC
  Filled 2012-11-28: qty 5

## 2012-11-28 MED ORDER — PROPOFOL 10 MG/ML IV BOLUS
INTRAVENOUS | Status: DC | PRN
  Administered 2012-11-28: 160 mg via INTRAVENOUS

## 2012-11-28 MED ORDER — PROMETHAZINE HCL 25 MG/ML IJ SOLN: 6.2500 mg | INTRAMUSCULAR | Status: DC | PRN

## 2012-11-28 MED ORDER — MIDAZOLAM HCL 2 MG/2ML IJ SOLN
INTRAMUSCULAR | Status: AC
  Filled 2012-11-28: qty 2

## 2012-11-28 MED ORDER — MIDAZOLAM HCL 2 MG/2ML IJ SOLN
INTRAMUSCULAR | Status: DC | PRN
  Administered 2012-11-28: 2 mg via INTRAVENOUS

## 2012-11-28 MED ORDER — LIDOCAINE HCL (CARDIAC) 20 MG/ML IV SOLN
INTRAVENOUS | Status: DC | PRN
  Administered 2012-11-28: 30 mg via INTRAVENOUS

## 2012-11-28 MED ORDER — BUPIVACAINE HCL (PF) 0.5 % IJ SOLN
INTRAMUSCULAR | Status: AC
  Filled 2012-11-28: qty 30

## 2012-11-28 MED ORDER — ONDANSETRON HCL 4 MG/2ML IJ SOLN
INTRAMUSCULAR | Status: AC
  Filled 2012-11-28: qty 2

## 2012-11-28 MED ORDER — KETOROLAC TROMETHAMINE 30 MG/ML IJ SOLN: 15.0000 mg | Freq: Once | INTRAMUSCULAR | Status: DC | PRN

## 2012-11-28 MED ORDER — FENTANYL CITRATE 0.05 MG/ML IJ SOLN
INTRAMUSCULAR | Status: AC
  Filled 2012-11-28: qty 2

## 2012-11-28 MED ORDER — KETOROLAC TROMETHAMINE 30 MG/ML IJ SOLN
INTRAMUSCULAR | Status: AC
  Filled 2012-11-28: qty 1

## 2012-11-28 MED ORDER — MISOPROSTOL 200 MCG PO TABS: 800.0000 ug | ORAL_TABLET | Freq: Once | ORAL | Status: DC

## 2012-11-28 MED ORDER — LACTATED RINGERS IV SOLN
INTRAVENOUS | Status: DC
  Administered 2012-11-28 (×2): via INTRAVENOUS

## 2012-11-28 MED ORDER — PROPOFOL 10 MG/ML IV EMUL
INTRAVENOUS | Status: AC
  Filled 2012-11-28: qty 20

## 2012-11-28 MED ORDER — KETOROLAC TROMETHAMINE 30 MG/ML IJ SOLN
INTRAMUSCULAR | Status: DC | PRN
  Administered 2012-11-28: 30 mg via INTRAVENOUS

## 2012-11-28 MED ORDER — FENTANYL CITRATE 0.05 MG/ML IJ SOLN
INTRAMUSCULAR | Status: DC | PRN
  Administered 2012-11-28 (×2): 50 ug via INTRAVENOUS

## 2012-11-28 MED ORDER — FENTANYL CITRATE 0.05 MG/ML IJ SOLN: 25.0000 ug | INTRAMUSCULAR | Status: DC | PRN

## 2012-11-28 MED ORDER — METHYLERGONOVINE MALEATE 0.2 MG/ML IJ SOLN
INTRAMUSCULAR | Status: AC
  Filled 2012-11-28: qty 1

## 2012-11-28 SURGICAL SUPPLY — 23 items
CATH ROBINSON RED A/P 16FR (CATHETERS) ×2 IMPLANT
CLOTH BEACON ORANGE TIMEOUT ST (SAFETY) ×2 IMPLANT
DECANTER SPIKE VIAL GLASS SM (MISCELLANEOUS) ×2 IMPLANT
DRAPE HYSTEROSCOPY (DRAPE) ×2 IMPLANT
GLOVE BIOGEL PI IND STRL 7.0 (GLOVE) ×1 IMPLANT
GLOVE BIOGEL PI INDICATOR 7.0 (GLOVE) ×1
GLOVE ECLIPSE 7.0 STRL STRAW (GLOVE) ×4 IMPLANT
GOWN PREVENTION PLUS XLARGE (GOWN DISPOSABLE) ×2 IMPLANT
GOWN STRL REIN XL XLG (GOWN DISPOSABLE) ×4 IMPLANT
KIT BERKELEY 1ST TRIMESTER 3/8 (MISCELLANEOUS) ×2 IMPLANT
NEEDLE SPNL 22GX3.5 QUINCKE BK (NEEDLE) ×2 IMPLANT
NS IRRIG 1000ML POUR BTL (IV SOLUTION) ×2 IMPLANT
PACK VAGINAL MINOR WOMEN LF (CUSTOM PROCEDURE TRAY) ×2 IMPLANT
PAD OB MATERNITY 4.3X12.25 (PERSONAL CARE ITEMS) ×2 IMPLANT
PAD PREP 24X48 CUFFED NSTRL (MISCELLANEOUS) ×2 IMPLANT
SET BERKELEY SUCTION TUBING (SUCTIONS) ×2 IMPLANT
SYR CONTROL 10ML LL (SYRINGE) ×2 IMPLANT
TOWEL OR 17X24 6PK STRL BLUE (TOWEL DISPOSABLE) ×4 IMPLANT
VACURETTE 10 RIGID CVD (CANNULA) IMPLANT
VACURETTE 12 RIGID CVD (CANNULA) ×2 IMPLANT
VACURETTE 7MM CVD STRL WRAP (CANNULA) IMPLANT
VACURETTE 8 RIGID CVD (CANNULA) IMPLANT
VACURETTE 9 RIGID CVD (CANNULA) IMPLANT

## 2012-11-28 NOTE — Transfer of Care (Signed)
Immediate Anesthesia Transfer of Care Note  Patient: Caroline Yang  Procedure(s) Performed: Procedure(s): DILATATION AND EVACUATION, WITH INTRA-OPERATIVE ULTRSOUND (N/A)  Patient Location: PACU  Anesthesia Type:General  Level of Consciousness: sedated and unresponsive  Airway & Oxygen Therapy: Patient Spontanous Breathing and Patient connected to nasal cannula oxygen  Post-op Assessment: Report given to PACU RN and Post -op Vital signs reviewed and stable  Post vital signs: Reviewed and stable  Complications: No apparent anesthesia complications

## 2012-11-28 NOTE — Op Note (Signed)
[name] PROCEDURE DATE: 11/28/2012  PREOPERATIVE DIAGNOSIS: 12-16 week missed abortion POSTOPERATIVE DIAGNOSIS: The same PROCEDURE:     Dilation and Evacuation with intraoperative sonogram  SURGEON:  Dr. Eber Jones L. Harraway-Smith  INDICATIONS: 32 y.o. [id number] with MAB at 12-[redacted] weeks gestation by measurement and 16.5 weeks by dates.  The risks of surgery were discussed with the patient including but not limited to: bleeding which may require transfusion; infection which may require antibiotics; injury to uterus or surrounding organs; need for additional procedures including laparotomy or laparoscopy; possibility of intrauterine scarring which may impair future fertility; and other postoperative/anesthesia complications. Written informed consent was obtained.    FINDINGS:  A 14 week size uterus, moderate amounts of products of conception, specimen sent to pathology.  ANESTHESIA:    General with paracervical block. INTRAVENOUS FLUIDS:  900 ml of LR ESTIMATED BLOOD LOSS:  60 ml. SPECIMENS:  Products of conception sent to pathology COMPLICATIONS:  None immediate.  PROCEDURE DETAILS:  The patient was taken to the operating room where monitored intravenous sedation was administered and was found to be adequate.  After an adequate timeout was performed, she was placed in the dorsal lithotomy position and examined; then prepped and draped in the sterile manner.   Her bladder was catheterized for an unmeasured amount of clear, yellow urine. A vaginal speculum was then placed in the patient's vagina and a single tooth tenaculum was applied to the anterior lip of the cervix.  A paracervical block using 20 ml of 0.5% Marcaine was administered. The cervix was gently dilated to accommodate a 12 mm curved suction curette that was gently advanced to the uterine fundus.  The suction device was then activated to a suction of 50-64mmHg and curette slowly rotated to clear the uterus of products of conception.  A  sharp curettage was then performed to confirm complete emptying of the uterus. Because of the question of dating a sonogram was ordered intraop to confirm that the uterus was emptied. There was minimal bleeding noted and the tenaculum removed there was a ter on the cervix which was not repaired because it was hemostatic.   All instruments were removed from the patient's vagina. The patient tolerated the procedure well and was taken to the recovery area awake, and in stable condition.  The patient will be discharged to home as per PACU criteria.  Routine postoperative instructions given.  She was prescribed Percocet, Ibuprofen and Doxycycline.  She will follow up in the clinic on 2 weeks for postoperative evaluation.

## 2012-11-28 NOTE — Anesthesia Preprocedure Evaluation (Signed)
Anesthesia Evaluation  Patient identified by MRN, date of birth, ID band Patient awake    Reviewed: Allergy & Precautions, H&P , Patient's Chart, lab work & pertinent test results, reviewed documented beta blocker date and time   History of Anesthesia Complications Negative for: history of anesthetic complications  Airway Mallampati: II TM Distance: >3 FB Neck ROM: full    Dental no notable dental hx.    Pulmonary neg pulmonary ROS,  breath sounds clear to auscultation  Pulmonary exam normal       Cardiovascular Exercise Tolerance: Good negative cardio ROS  Rhythm:regular Rate:Normal     Neuro/Psych negative neurological ROS  negative psych ROS   GI/Hepatic negative GI ROS, Neg liver ROS,   Endo/Other  negative endocrine ROS  Renal/GU Renal diseasenegative Renal ROS     Musculoskeletal   Abdominal   Peds  Hematology negative hematology ROS (+)   Anesthesia Other Findings   Reproductive/Obstetrics negative OB ROS                           Anesthesia Physical Anesthesia Plan  ASA: II  Anesthesia Plan: MAC   Post-op Pain Management:    Induction:   Airway Management Planned:   Additional Equipment:   Intra-op Plan:   Post-operative Plan:   Informed Consent: I have reviewed the patients History and Physical, chart, labs and discussed the procedure including the risks, benefits and alternatives for the proposed anesthesia with the patient or authorized representative who has indicated his/her understanding and acceptance.   Dental Advisory Given  Plan Discussed with: CRNA, Surgeon and Anesthesiologist  Anesthesia Plan Comments:         Anesthesia Quick Evaluation

## 2012-11-28 NOTE — Brief Op Note (Signed)
11/28/2012  10:10 AM  PATIENT:  Caroline Yang  32 y.o. female  PRE-OPERATIVE DIAGNOSIS:  Missed Abortion twelve weeks  POST-OPERATIVE DIAGNOSIS:  Missed Abortion twelve weeks  PROCEDURE:  Procedure(s): DILATATION AND EVACUATION, WITH INTRA-OPERATIVE ULTRSOUND (N/A)  SURGEON:  Surgeon(s) and Role:    * Willodean Rosenthal, MD - Primary  ANESTHESIA:   general and paracervical block  EBL:  Total I/O In: 900 [I.V.:900] Out: -   BLOOD ADMINISTERED:none  DRAINS: none   LOCAL MEDICATIONS USED:  MARCAINE     SPECIMEN:  Source of Specimen:  products of conception  DISPOSITION OF SPECIMEN:  PATHOLOGY  COUNTS:  YES  TOURNIQUET:  * No tourniquets in log *  DICTATION: .Note written in EPIC  PLAN OF CARE: Discharge to home after PACU  PATIENT DISPOSITION:  PACU - hemodynamically stable.   Delay start of Pharmacological VTE agent (>24hrs) due to surgical blood loss or risk of bleeding: not applicable

## 2012-11-28 NOTE — Anesthesia Postprocedure Evaluation (Signed)
Anesthesia Post Note  Patient: Caroline Yang  Procedure(s) Performed: Procedure(s) (LRB): DILATATION AND EVACUATION, WITH INTRA-OPERATIVE ULTRSOUND (N/A)  Anesthesia type: General  Patient location: PACU  Post pain: Pain level controlled  Post assessment: Post-op Vital signs reviewed  Last Vitals:  Filed Vitals:   11/28/12 1045  BP: 97/62  Pulse: 74  Temp:   Resp: 23    Post vital signs: Reviewed  Level of consciousness: sedated  Complications: No apparent anesthesia complications

## 2012-11-28 NOTE — H&P (Signed)
Caroline Yang is an 32 y.o. female. Pt with missed abortion at 16 weeks. She denies bleeding.  She presents for a D&C all risks were discussed in detail yesterday with an interpreter.  Pertinent Gynecological History: Bleeding: none  OB History: G3, P2002  Menstrual History:  Patient's last menstrual period was 08/02/2012.    Past Medical History  Diagnosis Date  . Kidney stones 2011    after birth of 2nd child    Past Surgical History  Procedure Laterality Date  . Wisdom tooth extraction      History reviewed. No pertinent family history.  Social History:  reports that she has never smoked. She does not have any smokeless tobacco history on file. She reports that she does not drink alcohol or use illicit drugs.  Allergies: No Known Allergies  Prescriptions prior to admission  Medication Sig Dispense Refill  . Prenatal Vit-Fe Fumarate-FA (PRENATAL MULTIVITAMIN) TABS tablet Take 1 tablet by mouth daily at 12 noon.        ROS  Blood pressure 120/74, pulse 86, temperature 98.6 F (37 C), temperature source Oral, resp. rate 18, last menstrual period 08/02/2012, SpO2 100.00%. Physical Exam Pt in NAD Lungs: CTA CV: RRR Abd: soft, NT, ND Results for orders placed during the hospital encounter of 11/28/12 (from the past 24 hour(s))  CBC     Status: Abnormal   Collection Time    11/28/12  8:08 AM      Result Value Range   WBC 6.8  4.0 - 10.5 K/uL   RBC 4.12  3.87 - 5.11 MIL/uL   Hemoglobin 11.4 (*) 12.0 - 15.0 g/dL   HCT 16.1 (*) 09.6 - 04.5 %   MCV 79.6  78.0 - 100.0 fL   MCH 27.7  26.0 - 34.0 pg   MCHC 34.8  30.0 - 36.0 g/dL   RDW 40.9  81.1 - 91.4 %   Platelets 327  150 - 400 K/uL    US Ob Limited  11/27/2012   OBSTETRICAL ULTRASOUND: This exam was performed within a [facility]. The OB US report was generated in the AS system, and faxed to the ordering physician.   This report is also available in [employer] and in  the [employer]. See [facility] Obstetric US report.   Assessment/Plan: 32 yo G3P2002 with missed abortion for dilation and curettage.  Reviewed risks of surgery and answered all questions.  Will proceed with surgery as scheduled.  HARRAWAY-SMITH, Kaisen Ackers 11/28/2012, 8:38 AM

## 2012-11-30 ENCOUNTER — Encounter (HOSPITAL_COMMUNITY): Payer: Self-pay | Admitting: Obstetrics & Gynecology

## 2012-12-04 ENCOUNTER — Telehealth: Payer: Self-pay | Admitting: *Deleted

## 2012-12-04 NOTE — Telephone Encounter (Signed)
Message copied by Gerome Apley on Wed Dec 04, 2012 11:58 AM ------      Message from: Wynelle Bourgeois L      Created: Wed Nov 27, 2012  2:38 PM      Regarding: Forgot to schedule her mammogram       Did a pap today.            Forgot to schedule her a mammogram while she was here            States has Medicare but goes to [facility].            Can we schedule her for a screening mammogram?            Thanks and sorry!! ------

## 2012-12-04 NOTE — Telephone Encounter (Signed)
Per chart review patient is 32 weeks, with no family history of breast cancer- routine mammogram not reccomended until age 32

## 2012-12-04 NOTE — Telephone Encounter (Signed)
Message copied by Gerome Apley on Wed Dec 04, 2012 11:57 AM ------      Message from: Wynelle Bourgeois L      Created: Wed Nov 27, 2012  2:38 PM      Regarding: Forgot to schedule her mammogram       Did a pap today.            Forgot to schedule her a mammogram while she was here            States has Medicare but goes to [facility].            Can we schedule her for a screening mammogram?            Thanks and sorry!! ------

## 2012-12-10 ENCOUNTER — Other Ambulatory Visit (HOSPITAL_COMMUNITY): Payer: BC Managed Care – PPO

## 2012-12-11 ENCOUNTER — Encounter: Payer: Self-pay | Admitting: Obstetrics & Gynecology

## 2012-12-11 ENCOUNTER — Ambulatory Visit (INDEPENDENT_AMBULATORY_CARE_PROVIDER_SITE_OTHER): Payer: BC Managed Care – PPO | Admitting: Obstetrics & Gynecology

## 2012-12-11 VITALS — BP 136/87 | HR 78 | Wt 175.0 lb

## 2012-12-11 DIAGNOSIS — Z3009 Encounter for other general counseling and advice on contraception: Secondary | ICD-10-CM

## 2012-12-11 DIAGNOSIS — Z09 Encounter for follow-up examination after completed treatment for conditions other than malignant neoplasm: Secondary | ICD-10-CM

## 2012-12-11 DIAGNOSIS — D62 Acute posthemorrhagic anemia: Secondary | ICD-10-CM

## 2012-12-11 DIAGNOSIS — O021 Missed abortion: Secondary | ICD-10-CM

## 2012-12-11 LAB — CBC
HCT: 33.6 % — ABNORMAL LOW (ref 36.0–46.0)
Hemoglobin: 11.2 g/dL — ABNORMAL LOW (ref 12.0–15.0)
MCHC: 33.3 g/dL (ref 30.0–36.0)
Platelets: 430 10*3/uL — ABNORMAL HIGH (ref 150–400)
WBC: 4.9 10*3/uL (ref 4.0–10.5)

## 2012-12-11 MED ORDER — ETONOGESTREL-ETHINYL ESTRADIOL 0.12-0.015 MG/24HR VA RING: VAGINAL_RING | VAGINAL | Status: DC

## 2012-12-11 MED ORDER — INTEGRA F 125-1 MG PO CAPS: 1.0000 | ORAL_CAPSULE | Freq: Every day | ORAL | Status: DC

## 2012-12-11 NOTE — Patient Instructions (Signed)
Dilation and Curettage or Vacuum Curettage  Care After  Refer to this sheet in the next few weeks. These instructions provide you with general information on caring for yourself after your procedure. Your caregiver may also give you more specific instructions. Your treatment has been planned according to current medical practices, but problems sometimes occur. Call your caregiver if you have any problems or questions after your procedure.  HOME CARE INSTRUCTIONS    Do not drive for 24 hours.   Wait 1 week before returning to strenuous activities.   Take your temperature 2 times a day for 4 days and write it down. Provide these temperatures to your caregiver if you develop a fever.   Avoid long periods of standing, and do no heavy lifting (more than 10 pounds or 4.5 kg), pushing, or pulling.   Limit stair climbing to once or twice a day.   Take rest periods often.   You may resume your usual diet.   Drink enough fluids to keep your urine clear or pale yellow.   You should return to your usual bowel function. If constipation should occur, you may:   Take a mild laxative with permission from your caregiver.   Add fruit and bran to your diet.   Drink more fluids.   Take showers instead of baths until your caregiver gives you permission to take baths.   Do not go swimming or use a hot tub until your caregiver gives you permission.   Try to have someone with you or available to you the first 24 to 48 hours, especially if you had a general anesthetic.   Do not douche, use tampons, or have intercourse until after your follow-up appointment, or when your caregiver approves.   Only take over-the-counter or prescription medicines for pain, discomfort, or fever as directed by your caregiver. Do not take aspirin. It can cause bleeding.   If a prescription was given, follow your caregiver's directions.   Keep all your follow-up appointments recommended by your caregiver.  SEEK MEDICAL CARE IF:    You have  increasing cramps or pain not relieved with medicine.   You have abdominal pain which does not seem to be related to the same area of earlier cramping and pain.   You have bad smelling vaginal discharge.   You have a rash.   You have problems with any medicine.  SEEK IMMEDIATE MEDICAL CARE IF:    You have bleeding that is heavier than a normal menstrual period.   You have a fever.   You have chest pain.   You have shortness of breath.   You feel dizzy or feel like fainting.   You pass out.   You have pain in your shoulder strap area.   You have heavy vaginal bleeding with or without blood clots.  MAKE SURE YOU:    Understand these instructions.   Will watch your condition.   Will get help right away if you are not doing well or get worse.  Document Released: 02/18/2000 Document Revised: 05/15/2011 Document Reviewed: 09/17/2008  ExitCare Patient Information 2014 [employer].

## 2012-12-11 NOTE — Progress Notes (Signed)
Subjective:     Patient ID: Caroline Yang, female   DOB: [birth year], 32 y.o.   MRN: [id number]  HPI Pt s/p D&C for missed ab.  Now for f/u.  Pt c/o ringing in her ears since the procedure and HA.  She is scheduled to take an [place] exam and has been staying up late to study.  This is new for her. She is also interested in contraception.  Not interested in a pregnancy for 1 year or more.  Denies SOB or dizziness.    Review of Systems     Objective:   Physical Exam BP 136/87  Pulse 78  Wt 175 lb (79.379 kg)  BMI 31.01 kg/m2  LMP 08/02/2012  Breastfeeding? Unknown Pt in NAD Abd: soft, NT, ND      Assessment:     F/u D&C for missed AB Contraception counseling Anemia     Plan:     CBC today Integra 1 po q day Nuvaring 1 ring per vagina for 3 weeks then out for 1 week (reviewed with pt using interpreter) F/u 3 months

## 2014-01-05 ENCOUNTER — Encounter: Payer: Self-pay | Admitting: Obstetrics & Gynecology

## 2014-01-07 LAB — OB RESULTS CONSOLE ANTIBODY SCREEN: ANTIBODY SCREEN: NEGATIVE

## 2014-01-07 LAB — OB RESULTS CONSOLE GC/CHLAMYDIA
CHLAMYDIA, DNA PROBE: NEGATIVE
Gonorrhea: NEGATIVE

## 2014-01-07 LAB — OB RESULTS CONSOLE ABO/RH: RH TYPE: POSITIVE

## 2014-01-07 LAB — OB RESULTS CONSOLE HIV ANTIBODY (ROUTINE TESTING): HIV: NONREACTIVE

## 2014-01-07 LAB — OB RESULTS CONSOLE RUBELLA ANTIBODY, IGM: RUBELLA: IMMUNE

## 2014-01-07 LAB — OB RESULTS CONSOLE RPR: RPR: NONREACTIVE

## 2014-01-07 LAB — OB RESULTS CONSOLE HEPATITIS B SURFACE ANTIGEN: HEP B S AG: NEGATIVE

## 2014-07-08 ENCOUNTER — Encounter (HOSPITAL_COMMUNITY): Payer: Self-pay | Admitting: *Deleted

## 2014-07-08 ENCOUNTER — Ambulatory Visit (HOSPITAL_COMMUNITY): Payer: Medicaid Other

## 2014-07-08 ENCOUNTER — Inpatient Hospital Stay (HOSPITAL_COMMUNITY)
Admission: RE | Admit: 2014-07-08 | Discharge: 2014-07-08 | Disposition: A | Discharge status: Home / Self Care | Payer: Medicaid Other | Source: Ambulatory Visit | Attending: Obstetrics and Gynecology | Admitting: Obstetrics and Gynecology

## 2014-07-08 ENCOUNTER — Other Ambulatory Visit (HOSPITAL_COMMUNITY): Payer: Self-pay | Admitting: Obstetrics and Gynecology

## 2014-07-08 ENCOUNTER — Encounter (HOSPITAL_COMMUNITY): Payer: Self-pay

## 2014-07-08 ENCOUNTER — Inpatient Hospital Stay (HOSPITAL_COMMUNITY)
Admission: AD | Admit: 2014-07-08 | Discharge: 2014-07-29 | DRG: 781 | Disposition: A | Discharge status: Home / Self Care | Payer: Medicaid Other | Source: Ambulatory Visit | Attending: Obstetrics and Gynecology | Admitting: Obstetrics and Gynecology

## 2014-07-08 DIAGNOSIS — E559 Vitamin D deficiency, unspecified: Secondary | ICD-10-CM | POA: Diagnosis present

## 2014-07-08 DIAGNOSIS — K219 Gastro-esophageal reflux disease without esophagitis: Secondary | ICD-10-CM | POA: Diagnosis not present

## 2014-07-08 DIAGNOSIS — IMO0002 Reserved for concepts with insufficient information to code with codable children: Secondary | ICD-10-CM

## 2014-07-08 DIAGNOSIS — Z3A28 28 weeks gestation of pregnancy: Secondary | ICD-10-CM | POA: Diagnosis present

## 2014-07-08 DIAGNOSIS — O09293 Supervision of pregnancy with other poor reproductive or obstetric history, third trimester: Secondary | ICD-10-CM

## 2014-07-08 DIAGNOSIS — O4103X1 Oligohydramnios, third trimester, fetus 1: Secondary | ICD-10-CM

## 2014-07-08 DIAGNOSIS — B951 Streptococcus, group B, as the cause of diseases classified elsewhere: Secondary | ICD-10-CM | POA: Diagnosis present

## 2014-07-08 DIAGNOSIS — O1493 Unspecified pre-eclampsia, third trimester: Secondary | ICD-10-CM

## 2014-07-08 DIAGNOSIS — O365930 Maternal care for other known or suspected poor fetal growth, third trimester, not applicable or unspecified: Secondary | ICD-10-CM | POA: Diagnosis present

## 2014-07-08 DIAGNOSIS — O321XX0 Maternal care for breech presentation, not applicable or unspecified: Secondary | ICD-10-CM | POA: Diagnosis present

## 2014-07-08 DIAGNOSIS — O99283 Endocrine, nutritional and metabolic diseases complicating pregnancy, third trimester: Secondary | ICD-10-CM | POA: Diagnosis present

## 2014-07-08 DIAGNOSIS — O99820 Streptococcus B carrier state complicating pregnancy: Secondary | ICD-10-CM | POA: Diagnosis present

## 2014-07-08 DIAGNOSIS — Z3689 Encounter for other specified antenatal screening: Secondary | ICD-10-CM

## 2014-07-08 DIAGNOSIS — O99013 Anemia complicating pregnancy, third trimester: Secondary | ICD-10-CM | POA: Diagnosis present

## 2014-07-08 DIAGNOSIS — Z6833 Body mass index (BMI) 33.0-33.9, adult: Secondary | ICD-10-CM

## 2014-07-08 DIAGNOSIS — O283 Abnormal ultrasonic finding on antenatal screening of mother: Secondary | ICD-10-CM | POA: Diagnosis present

## 2014-07-08 DIAGNOSIS — O4103X0 Oligohydramnios, third trimester, not applicable or unspecified: Secondary | ICD-10-CM | POA: Diagnosis present

## 2014-07-08 DIAGNOSIS — O99213 Obesity complicating pregnancy, third trimester: Secondary | ICD-10-CM | POA: Diagnosis present

## 2014-07-08 DIAGNOSIS — E669 Obesity, unspecified: Secondary | ICD-10-CM | POA: Diagnosis present

## 2014-07-08 DIAGNOSIS — O99613 Diseases of the digestive system complicating pregnancy, third trimester: Secondary | ICD-10-CM | POA: Diagnosis not present

## 2014-07-08 DIAGNOSIS — O4100X0 Oligohydramnios, unspecified trimester, not applicable or unspecified: Secondary | ICD-10-CM | POA: Diagnosis present

## 2014-07-08 LAB — LACTATE DEHYDROGENASE: LDH: 134 U/L (ref 98–192)

## 2014-07-08 LAB — COMPREHENSIVE METABOLIC PANEL
ALT: 9 U/L — AB (ref 14–54)
AST: 14 U/L — ABNORMAL LOW (ref 15–41)
Albumin: 3.1 g/dL — ABNORMAL LOW (ref 3.5–5.0)
Alkaline Phosphatase: 85 U/L (ref 38–126)
Anion gap: 8 (ref 5–15)
BUN: 5 mg/dL — ABNORMAL LOW (ref 6–20)
CALCIUM: 9.3 mg/dL (ref 8.9–10.3)
CO2: 22 mmol/L (ref 22–32)
Chloride: 106 mmol/L (ref 101–111)
Creatinine, Ser: 0.38 mg/dL — ABNORMAL LOW (ref 0.44–1.00)
GLUCOSE: 86 mg/dL (ref 70–99)
Potassium: 3.7 mmol/L (ref 3.5–5.1)
Sodium: 136 mmol/L (ref 135–145)
Total Bilirubin: 0.3 mg/dL (ref 0.3–1.2)
Total Protein: 6.8 g/dL (ref 6.5–8.1)

## 2014-07-08 LAB — CBC WITH DIFFERENTIAL/PLATELET
Basophils Absolute: 0 10*3/uL (ref 0.0–0.1)
Basophils Relative: 0 % (ref 0–1)
EOS ABS: 0.1 10*3/uL (ref 0.0–0.7)
Eosinophils Relative: 1 % (ref 0–5)
HCT: 29.4 % — ABNORMAL LOW (ref 36.0–46.0)
Hemoglobin: 9.7 g/dL — ABNORMAL LOW (ref 12.0–15.0)
Lymphocytes Relative: 26 % (ref 12–46)
Lymphs Abs: 1.9 10*3/uL (ref 0.7–4.0)
MCH: 25.9 pg — ABNORMAL LOW (ref 26.0–34.0)
MCHC: 33 g/dL (ref 30.0–36.0)
MCV: 78.4 fL (ref 78.0–100.0)
MONO ABS: 0.5 10*3/uL (ref 0.1–1.0)
MONOS PCT: 6 % (ref 3–12)
NEUTROS ABS: 5 10*3/uL (ref 1.7–7.7)
Neutrophils Relative %: 67 % (ref 43–77)
Platelets: 336 10*3/uL (ref 150–400)
RBC: 3.75 MIL/uL — ABNORMAL LOW (ref 3.87–5.11)
RDW: 13.7 % (ref 11.5–15.5)
WBC: 7.5 10*3/uL (ref 4.0–10.5)

## 2014-07-08 LAB — US OB FOLLOW UP

## 2014-07-08 LAB — ABO/RH: ABO/RH(D): A POS

## 2014-07-08 LAB — TYPE AND SCREEN
ABO/RH(D): A POS
ANTIBODY SCREEN: NEGATIVE

## 2014-07-08 LAB — PROTEIN / CREATININE RATIO, URINE
Creatinine, Urine: 55 mg/dL
PROTEIN CREATININE RATIO: 0.16 mg/mg{creat} — AB (ref 0.00–0.15)
Total Protein, Urine: 9 mg/dL

## 2014-07-08 LAB — URIC ACID: Uric Acid, Serum: 3.4 mg/dL (ref 2.3–6.6)

## 2014-07-08 LAB — AMNISURE RUPTURE OF MEMBRANE (ROM) NOT AT ARMC: AMNISURE: NEGATIVE

## 2014-07-08 LAB — OB RESULTS CONSOLE GBS: GBS: POSITIVE

## 2014-07-08 LAB — GROUP B STREP BY PCR: GROUP B STREP BY PCR: POSITIVE — AB

## 2014-07-08 MED ORDER — ZOLPIDEM TARTRATE 5 MG PO TABS
5.0000 mg | ORAL_TABLET | Freq: Every evening | ORAL | Status: DC | PRN
  Administered 2014-07-09: 5 mg via ORAL
  Filled 2014-07-08: qty 1

## 2014-07-08 MED ORDER — CALCIUM CARBONATE ANTACID 500 MG PO CHEW: 2.0000 | CHEWABLE_TABLET | ORAL | Status: DC | PRN

## 2014-07-08 MED ORDER — DOCUSATE SODIUM 100 MG PO CAPS
100.0000 mg | ORAL_CAPSULE | Freq: Every day | ORAL | Status: DC
  Administered 2014-07-09 – 2014-07-29 (×21): 100 mg via ORAL
  Filled 2014-07-08 (×21): qty 1

## 2014-07-08 MED ORDER — BETAMETHASONE SOD PHOS & ACET 6 (3-3) MG/ML IJ SUSP
12.0000 mg | Freq: Once | INTRAMUSCULAR | Status: AC
  Administered 2014-07-08: 12 mg via INTRAMUSCULAR
  Filled 2014-07-08: qty 2

## 2014-07-08 MED ORDER — BETAMETHASONE SOD PHOS & ACET 6 (3-3) MG/ML IJ SUSP
12.0000 mg | Freq: Once | INTRAMUSCULAR | Status: AC
  Administered 2014-07-09: 12 mg via INTRAMUSCULAR
  Filled 2014-07-08: qty 2

## 2014-07-08 MED ORDER — PRENATAL MULTIVITAMIN CH
1.0000 | ORAL_TABLET | Freq: Every day | ORAL | Status: DC
  Administered 2014-07-09 – 2014-07-29 (×19): 1 via ORAL
  Filled 2014-07-08 (×20): qty 1

## 2014-07-08 MED ORDER — LACTATED RINGERS IV SOLN
INTRAVENOUS | Status: DC
  Administered 2014-07-08 – 2014-07-13 (×11): via INTRAVENOUS

## 2014-07-08 MED ORDER — ACETAMINOPHEN 325 MG PO TABS
650.0000 mg | ORAL_TABLET | ORAL | Status: DC | PRN
  Administered 2014-07-26: 650 mg via ORAL
  Filled 2014-07-08: qty 2

## 2014-07-08 NOTE — H&P (Signed)
Caroline Yang is a 34 y.o. female, G4 P2012 at 28.2 weeks present to [facility] from the office with severe IUGR, severe oligo 0.949, BPP 6/8 - fluid, dopplers wnl no loss of flow, SD 3.09, EFW 1lbs 13oz - 2%, fern and nitrazine negative, FHT 130 with a decel to 100s.  Admit to AP per MFM  Patient Active Problem List   Diagnosis Date Noted  . Missed abortion 11/27/2012  . Abnormal findings on prenatal screening 11/02/2012  . First trimester pregnancy 10/02/2012    Pregnancy Course: Patient entered care at 8.1 weeks.   EDC of 09/04/2014 was established by LMP.      [place] evaluations:   12.1 weeks - Dating: FHR 152,  Anterior placenta, anterverted uterus, S=D  20.2 weeks - Anatomy:Anatomy scan. EFW =11 oz.= 27%tile.FHR 145 bpm. Breech pres. Anterior plaenta. Plaental edge to cx= 5.3 cm. PCI is seen. Normal fluid. AP pocket= 3.3 cm. Meas: c/w established GA. Open hands,5th digit,NB, AA, DA seen.       28.2 weeks - AUA 25.4, EFW 1lbs 13oz - 2%, AFI 0.949, FHR 130, frank breech,   Significant prenatal events:   Insufficient weight gain, growth restriction, vit D deficiency   Last evaluation:   28.2 weeks     Reason for admission:  Growth restriction  Pt States:   Contractions Frequency: none         Contraction severity: n/a         Fetal activity: +FM  OB History    Gravida Para Term Preterm AB TAB SAB Ectopic Multiple Living   4 2 2       2      Past Medical History  Diagnosis Date  . Kidney stones 2011    after birth of 2nd child   Past Surgical History  Procedure Laterality Date  . Wisdom tooth extraction    . Dilation and evacuation N/A 11/28/2012    Procedure: DILATATION AND EVACUATION, WITH INTRA-OPERATIVE ULTRSOUND;  Surgeon: Lavonia Drafts, MD;  Location: [facility];  Service: Gynecology;  Laterality: N/A;   Family History: family history includes Thyroid disease in her son. Social History:  reports that she has never smoked. She does not have any smokeless tobacco history on  file. She reports that she does not drink alcohol or use illicit drugs.   Prenatal Transfer Tool  Maternal Diabetes: 159 - Failed 1 hour, need 3hr Genetic Screening: Normal Maternal Ultrasounds/Referrals: Abnormal:  Findings:   IUGR Fetal Ultrasounds or other Referrals:  Referred to Materal Fetal Medicine  Maternal Substance Abuse:  No Significant Maternal Medications:  None Significant Maternal Lab Results: None   ROS:  See HPI above, all other systems are negative  No Known Allergies    Blood pressure 121/62, pulse 92, temperature 97.8 F (36.6 C), temperature source Oral, resp. rate 16, height 5\' 2"  (1.575 m), weight 190 lb 12.8 oz (86.546 kg), last menstrual period 12/22/2013, unknown if currently breastfeeding.  Maternal Exam:  Uterine Assessment: Contraction frequency is rare.  Abdomen: Gravid, non tender. Fundal height is aga.  Normal external genitalia, vulva, cervix, uterus and adnexa.  No lesions noted on exam.  Pelvis adequate for delivery. Fetal presentation: breech by [place] today  Fetal Exam:  Monitor Surveillance : Continuous Monitoring Mode: Ultrasound.  NICHD: Category CTXs: none EFW   1lbs 13oz by [place] today  Physical Exam: Nursing note and vitals reviewed General: alert and cooperative She appears well nourished Psychiatric: Normal mood and affect. Her behavior is normal Head:  Normocephalic Eyes: Pupils are equal, round, and reactive to light Neck: Normal range of motion Cardiovascular: RRR without murmur  Respiratory: CTAB. Effort normal  Abd: soft, non-tender, +BS, no rebound, no guarding  Genitourinary: Vagina normal  Neurological: A&Ox3 Skin: Warm and dry  Musculoskeletal: Normal range of motion  Homan's sign negative bilaterally No evidence of DVTs.  Edema: no edema DTR: 2+ Clonus: None   Prenatal labs: ABO, Rh: --/--/A POS (05/04 1820) Antibody: PENDING (05/04 1820) Rubella:   immune RPR:   NR HBsAg:   negative HIV:   NR GBS:  Positive (05/04 1841) Sickle cell/Hgb electrophoresis:  WNL Pap:  Normal 02/17/14 GC:   negative Chlamydia: negative Genetic screenings:  negative Glucola:  pending  Assessment:  IUP at 28.2 weeks NICHD: Category Membranes: intact GBS positive Diagnosis: IUGR Amnisure negative  Plan:  Admit to Antepartum unit Routing [facility] orders IV pain medication per orders Prophylaxis abx if necessary Diet: reg Ambien 10mg  po hs prn for sleep Labs: PIH, GBS, Amnisure, ANA, Lupus, T&S,  SCD BMZ con't FM Per MFM delivery if ABS flow or NRFHT   Gabriela Giannelli, CNM, MSN 07/08/2014, 7:01 PM

## 2014-07-09 ENCOUNTER — Encounter (HOSPITAL_COMMUNITY): Payer: Self-pay | Admitting: Obstetrics and Gynecology

## 2014-07-09 ENCOUNTER — Inpatient Hospital Stay (HOSPITAL_COMMUNITY): Payer: Medicaid Other

## 2014-07-09 MED ORDER — FERROUS SULFATE 325 (65 FE) MG PO TABS
325.0000 mg | ORAL_TABLET | Freq: Two times a day (BID) | ORAL | Status: DC
  Administered 2014-07-09 – 2014-07-29 (×39): 325 mg via ORAL
  Filled 2014-07-09 (×39): qty 1

## 2014-07-09 NOTE — Progress Notes (Signed)
34 y.o. year old female,at [redacted]w[redacted]d gestation.  SUBJECTIVE:  Doing well.  OBJECTIVE:  BP 123/71 mmHg  Pulse 90  Temp(Src) 97.9 F (36.6 C) (Oral)  Resp 20  Ht 5\' 2"  (1.575 m)  Wt 190 lb 12.8 oz (86.546 kg)  BMI 34.89 kg/m2  SpO2 100%  LMP 12/22/2013  Fetal Heart Tones:  Category 1 fetal heart rate tracing.  Contractions:          Occasional  Maternal fetal medicine recommendation:  Betamethasone  NICU consult  In-hospital monitoring  Cesarean delivery if fetal distress (breech presentation)  Repeat ultrasound including Doppler studies on 07/10/2014  ASSESSMENT:  [redacted]w[redacted]d Weeks Pregnancy  Intrauterine growth retardation  Oligohydramnios  PLAN:  Plan for repeat ultrasound on 07/10/2014 (rather than today).  Second dose of betamethasone today.  Fetal monitoring throughout the night.  Gildardo Cranker, M.D.

## 2014-07-09 NOTE — Consult Note (Signed)
Neonatology Consult to Antenatal Patient:  I was asked by Dr. Mancel Bale to see this patient in order to provide antenatal counseling due to severe IUGR, oligohydramnios.  [name] was admitted yesterday and is now 2 3/[redacted] weeks GA. AFI is less than 1 and EFW is 781 grams. The BPP is 6/8. She is not hypertensive, so the reason for IUGR is unknown. She is currently not having active labor. She is getting BMZ. The baby is female and is her third baby.  I spoke with the patient alone. I offered to get a translator, but she said she could understand me well. I let her know that, due to the significant oligohydramnios and the possibility that it could have affected fetal lung development, there is a small chance that the baby will not respond to resuscitative measures or may only respond briefly. Then we talked about the more likely case that the baby has been able to have lung development consistent with GA of [redacted] weeks. We  discussed the worst case of delivery in the next 1-2 days, including usual DR management, probable respiratory complications and need for support, IV access, feedings (mother desires breast feeding, which was encouraged), LOS, Mortality and Morbidity, and long term outcomes. She did not have any questions at this time. I offered a NICU tour to any interested family members; she thought her husband would like to see the NICU. I would be glad to come back if she has more questions later.  Thank you for asking me to see this patient.  Real Cons, MD Neonatologist  The total length of face-to-face or floor/unit time for this encounter was 25 minutes. Counseling and/or coordination of care was 20 minutes of the above.

## 2014-07-09 NOTE — Progress Notes (Signed)
Pt sitting up in the bed to eat her breakfast, unable to trace FHR, cardio readjusted to a FHR of 135.

## 2014-07-09 NOTE — Progress Notes (Signed)
34 y.o. year old female,at [redacted]w[redacted]d gestation.  SUBJECTIVE:  Doing well. The patient reports that the baby is actively moving.  OBJECTIVE:  BP 113/67 mmHg  Pulse 95  Temp(Src) 97.5 F (36.4 C) (Oral)  Resp 20  Ht 5\' 2"  (1.575 m)  Wt 190 lb 12.8 oz (86.546 kg)  BMI 34.89 kg/m2  LMP 12/22/2013  Fetal Heart Tones:  Category 1 most of the time. Occasional deceleration present.  Contractions:          Occasional  Abdomen nontender  Results for orders placed or performed during the hospital encounter of 07/08/14 (from the past 24 hour(s))  ANA *Canceled*     Status: None ()   Collection Time: 07/08/14  5:21 PM   Narrative   LIS Cancel (ORR/DE = Data Error)  Group B strep by PCR     Status: Abnormal   Collection Time: 07/08/14  5:25 PM  Result Value Ref Range   Group B strep by PCR POSITIVE (A) NEGATIVE  Amnisure rupture of membrane (rom)     Status: None   Collection Time: 07/08/14  5:44 PM  Result Value Ref Range   Amnisure ROM NEGATIVE   Type and screen     Status: None   Collection Time: 07/08/14  6:20 PM  Result Value Ref Range   ABO/RH(D) A POS    Antibody Screen NEG    Sample Expiration 07/11/2014   Comprehensive metabolic panel     Status: Abnormal   Collection Time: 07/08/14  6:20 PM  Result Value Ref Range   Sodium 136 135 - 145 mmol/L   Potassium 3.7 3.5 - 5.1 mmol/L   Chloride 106 101 - 111 mmol/L   CO2 22 22 - 32 mmol/L   Glucose, Bld 86 70 - 99 mg/dL   BUN 5 (L) 6 - 20 mg/dL   Creatinine, Ser 0.38 (L) 0.44 - 1.00 mg/dL   Calcium 9.3 8.9 - 10.3 mg/dL   Total Protein 6.8 6.5 - 8.1 g/dL   Albumin 3.1 (L) 3.5 - 5.0 g/dL   AST 14 (L) 15 - 41 U/L   ALT 9 (L) 14 - 54 U/L   Alkaline Phosphatase 85 38 - 126 U/L   Total Bilirubin 0.3 0.3 - 1.2 mg/dL   GFR calc non Af Amer >60 >60 mL/min   GFR calc Af Amer >60 >60 mL/min   Anion gap 8 5 - 15  CBC with Differential/Platelet     Status: Abnormal   Collection Time: 07/08/14  6:20 PM  Result Value Ref Range   WBC 7.5 4.0 - 10.5 K/uL   RBC 3.75 (L) 3.87 - 5.11 MIL/uL   Hemoglobin 9.7 (L) 12.0 - 15.0 g/dL   HCT 29.4 (L) 36.0 - 46.0 %   MCV 78.4 78.0 - 100.0 fL   MCH 25.9 (L) 26.0 - 34.0 pg   MCHC 33.0 30.0 - 36.0 g/dL   RDW 13.7 11.5 - 15.5 %   Platelets 336 150 - 400 K/uL   Neutrophils Relative % 67 43 - 77 %   Neutro Abs 5.0 1.7 - 7.7 K/uL   Lymphocytes Relative 26 12 - 46 %   Lymphs Abs 1.9 0.7 - 4.0 K/uL   Monocytes Relative 6 3 - 12 %   Monocytes Absolute 0.5 0.1 - 1.0 K/uL   Eosinophils Relative 1 0 - 5 %   Eosinophils Absolute 0.1 0.0 - 0.7 K/uL   Basophils Relative 0 0 - 1 %   Basophils  Absolute 0.0 0.0 - 0.1 K/uL  Uric acid     Status: None   Collection Time: 07/08/14  6:20 PM  Result Value Ref Range   Uric Acid, Serum 3.4 2.3 - 6.6 mg/dL  Lactate dehydrogenase     Status: None   Collection Time: 07/08/14  6:20 PM  Result Value Ref Range   LDH 134 98 - 192 U/L  ABO/Rh     Status: None   Collection Time: 07/08/14  6:20 PM  Result Value Ref Range   ABO/RH(D) A POS   OB RESULT CONSOLE Group B Strep     Status: None   Collection Time: 07/08/14  6:41 PM  Result Value Ref Range   GBS Positive   Protein / creatinine ratio, urine     Status: Abnormal   Collection Time: 07/08/14  8:58 PM  Result Value Ref Range   Creatinine, Urine 55.00 mg/dL   Total Protein, Urine 9 mg/dL   Protein Creatinine Ratio 0.16 (H) 0.00 - 0.15 mg/mg[Cre]     ASSESSMENT:  [redacted]w[redacted]d Weeks Pregnancy  Intrauterine growth retardation  Oligohydramnios  Abnormal Doppler studies  Anemia  Obesity  PLAN:  We will continue in-hospital observation for now.  The patient will receive her second dose of betamethasone today.  Continue continuous monitoring.  Repeat Doppler study tomorrow and perhaps today (will follow-up with formal Maternal Fetal medicine recommendations when available).  Gildardo Cranker, M.D.

## 2014-07-10 ENCOUNTER — Inpatient Hospital Stay (HOSPITAL_COMMUNITY)
Admission: AD | Admit: 2014-07-10 | Discharge: 2014-07-10 | Disposition: A | Discharge status: Home / Self Care | Payer: Medicaid Other | Source: Ambulatory Visit | Attending: Obstetrics and Gynecology | Admitting: Obstetrics and Gynecology

## 2014-07-10 ENCOUNTER — Inpatient Hospital Stay (HOSPITAL_COMMUNITY): Payer: Medicaid Other

## 2014-07-10 ENCOUNTER — Ambulatory Visit (HOSPITAL_COMMUNITY): Payer: Medicaid Other

## 2014-07-10 ENCOUNTER — Encounter (HOSPITAL_COMMUNITY): Payer: Self-pay

## 2014-07-10 DIAGNOSIS — IMO0002 Reserved for concepts with insufficient information to code with codable children: Secondary | ICD-10-CM

## 2014-07-10 DIAGNOSIS — O321XX0 Maternal care for breech presentation, not applicable or unspecified: Secondary | ICD-10-CM

## 2014-07-10 DIAGNOSIS — O4103X0 Oligohydramnios, third trimester, not applicable or unspecified: Secondary | ICD-10-CM

## 2014-07-10 LAB — CARDIOLIPIN ANTIBODIES, IGG, IGM, IGA
Anticardiolipin IgA: <9 APL U/mL (ref 0–11)
Anticardiolipin IgM: <9 MPL U/mL (ref 0–12)

## 2014-07-10 NOTE — Progress Notes (Addendum)
[facility] CONSULT  Patient Name: Caroline Yang Medical Record Number:  [id number] Date of Birth: [birth year] Requesting Physician Name:  Ena Dawley, MD Date of Service: 07/10/2014  Chief Complaint IUGR, elevated Dopplers, Oligohydramnios  History of Present Illness Caroline Yang was seen today for prenatal diagnosis secondary to IUGR, elevated Dopplers, Oligohydramnios at the request of Dr. Raphael Gibney.  The patient is a 34 y.o. [id number],LD [redacted]w[redacted]d with an EDD of 09/28/2014, by Last Menstrual Period dating method.  On her most recent ultrasound it was noted her baby was severely IUGR with an AFI of 2 and elevated umbilical artery Dopplers.  First trimester screen was low risk.   Review of Systems Pertinent items are noted in HPI.  Patient History OB History  Gravida Para Term Preterm AB SAB TAB Ectopic Multiple Living  4 2 2       2     # Outcome Date GA Lbr Len/2nd Weight Sex Delivery Anes PTL Lv  4 Current           3 Term 07/16/09 [redacted]w[redacted]d  5 lb 8.2 oz (2.5 kg) M Vag-Spont  N   2 Term 09/22/05 [redacted]w[redacted]d  6 lb 9.8 oz (3 kg) M Vag-Spont  N Y     Comments: PPROM- labor augmented  1 Gravida              Comments: System Generated. Please review and update pregnancy details.      Past Medical History  Diagnosis Date  . Kidney stones 2011    after birth of 2nd child    Past Surgical History  Procedure Laterality Date  . Wisdom tooth extraction    . Dilation and evacuation N/A 11/28/2012    Procedure: DILATATION AND EVACUATION, WITH INTRA-OPERATIVE ULTRSOUND;  Surgeon: Lavonia Drafts, MD;  Location: [facility];  Service: Gynecology;  Laterality: N/A;    History   Social History  . Marital Status: Married    Spouse Name: N/A  . Number of Children: N/A  . Years of Education: N/A   Social History Main Topics  . Smoking status: Never Smoker   . Smokeless tobacco: Not on file  . Alcohol Use: No  . Drug Use: No  . Sexual Activity: Yes   Other Topics Concern   . None   Social History Narrative    Family History  Problem Relation Age of Onset  . Thyroid disease Son    In addition, the patient has no family history of mental retardation, birth defects, or genetic diseases.  Physical Examination BP 128/66 RR 20 P-94  General appearance - alert, well appearing, and in no distress  Assessment and Recommendations SIUP at 28+4 weeks here for rescan secondary to Severe fetal growth restriction; severe oligohydramnios;   breech presentation oligohydramnios- AFI 4.58, slightly improved from prior but still no 2x2 pocket Normal detailed fetal anatomy on prior scan with limited views of face, DA and CI EFW < 10th %tile; 781 grams (1+12); overall, lagging by 3 weeks; AC lagging by 4 weeks on 5/4 BPP 4/8 (-2 for no 2x2 pocket of fluid, -2 for breathing) UA Dopplers were elevated for this GA; continuous diastolic flow (no absent or reverse flow)  Recommend continued admission to antenatal unit with continuous electronic fetal monitoring S/p BMZ course 5/4-5/5 s/p NICU consult Repeat BPP this afternoon Would deliver by cesarean if tracing is non-reassuring but would not deliver at this point for ultrasound findings alone.  Discussed with patient need to remain inpatient until either delivery  or improvement of ultrasound findings.      30 minutes was spent with the patient and coordinating her care.  >50% of which was devoted to face to face counseling.   George Hugh, MD

## 2014-07-10 NOTE — Progress Notes (Addendum)
Hospital day # 2 pregnancy at [redacted]w[redacted]d--Severe IUGR, oligohydramnios, elevated dopplers, breech presentation, GBS positive  S:  Just returned from [place]--eating breakfast.      Perception of contractions: None      Vaginal bleeding: None       Vaginal discharge:  None       Fetal movement:  Frequent  O: BP 128/66 mmHg  Pulse 94  Temp(Src) 97.6 F (36.4 C) (Oral)  Resp 20  Ht 5\' 2"  (1.575 m)  Wt 86.546 kg (190 lb 12.8 oz)  BMI 34.89 kg/m2  SpO2 98%  LMP 12/22/2013      Fetal tracings:  Overall reassuring.  Segments of moderate variability, segments of decreased variability.  No repetitive decels.      Contractions:   None      Uterus non-tender      Extremities: no significant edema and no signs of DVT          Labs:  T&S q 3 days, last draw 07/08/14                 Lupus panel and cardiolipin antibodies pending.       Meds:  . docusate sodium  100 mg Oral Daily  . ferrous sulfate  325 mg Oral BID WC  . prenatal multivitamin  1 tablet Oral Q1200  Completed betamethasone course 07/09/14  [place] results and MFM consult note: Assessment and Recommendations [facility] at 28+4 weeks here for rescan secondary to Severe fetal growth restriction; severe oligohydramnios;   breech presentation oligohydramnios- AFI 4.58, slightly improved from prior but still no 2x2 pocket Normal detailed fetal anatomy on prior scan with limited views of face, DA and CI EFW < 10th %tile; 781 grams (1+12); overall, lagging by 3 weeks; AC lagging by 4 weeks on 5/4 BPP 4/8 (-2 for no 2x2 pocket of fluid, -2 for breathing) UA Dopplers were elevated for this GA; continuous diastolic flow (no absent or reverse flow)  Recommend continued admission to antenatal unit with continuous electronic fetal monitoring S/p BMZ course 5/4-5/5 s/p NICU consult Repeat BPP this afternoon Would deliver by cesarean if tracing is non-reassuring but would not deliver at this point for ultrasound findings alone.  Discussed with patient need  to remain inpatient until either delivery or improvement of ultrasound findings.    A: [redacted]w[redacted]d with severe IUGR, oligohydramnios, elevated dopplers, breech, +GBS BPP 4/8 this am (-2 fluid and breathing) FHR stable  P: Continue current plan of care Upcoming tests/treatments:  Repeat BPP at 2pm per MFM recommendation.  Needs 3 hour GTT--will await several days after 2nd betamethasone dose Reviewed US findings with patient and family member, questions reviewed.      MDs will follow  Donnel Saxon CNM, [place] 07/10/2014 11:33 AM   I saw and examined patient and agree with above findings assessment and plan. Repeat BPP this p.m. shows unchanged BPP of 4 out of 8 (-2 fluid, -2 for breathing). NST shows baseline 120, moderate variability, reactive. No recent decelerations. I discussed case with Dr. Burnett Harry from [place]. Continue with continuous NST and patient for follow-up BPP on Monday, for BPP sooner if problem with NST. I discussed plan of care with patient at bedside with her husband translating. She understands that if necessary to deliver she will require a cesarean delivery given breech presentation, patient is agreeable.  Dr. Alesia Richards.

## 2014-07-11 LAB — TYPE AND SCREEN
ABO/RH(D): A POS
Antibody Screen: NEGATIVE

## 2014-07-11 MED ORDER — LACTATED RINGERS IV BOLUS (SEPSIS)
250.0000 mL | Freq: Once | INTRAVENOUS | Status: AC
  Administered 2014-07-11: 250 mL via INTRAVENOUS

## 2014-07-11 MED ORDER — GUAIFENESIN ER 600 MG PO TB12
600.0000 mg | ORAL_TABLET | Freq: Two times a day (BID) | ORAL | Status: DC | PRN
  Filled 2014-07-11: qty 1

## 2014-07-11 NOTE — Progress Notes (Addendum)
Hospital day # 3 pregnancy at [redacted]w[redacted]d--oligohydramnios, elevated dopplers, breech presentation, GBS positive  S:  Perception of contractions: none      Vaginal bleeding: none now       Vaginal discharge:  no significant change      +FM Frequent bathroom trips for 10-15 each.  Pt denies problem or concern with bowels.  Importance of fetal surveillance reviewed and stress.  Pt encouraged to limit the time off the monitor to 2-4 minutes  O: BP 128/49 mmHg  Pulse 66  Temp(Src) 98.3 F (36.8 C) (Oral)  Resp 20  Ht 5\' 2"  (1.575 m)  Wt 190 lb 12.8 oz (86.546 kg)  BMI 34.89 kg/m2  SpO2 100%  LMP 12/22/2013      Fetal tracings:occasional decels through out the night.  FHR 145, moderate variability, Prolong decel observed at 1052 unsure when it started d/t non fetal tracing.        Contractions:  none       Uterus gravid and non-tender      Extremities: no significant edema and no signs of DVT          Labs:  No results found for this or any previous visit (from the past 24 hour(s)).       Meds:  Current facility-administered medications:  .  acetaminophen (TYLENOL) tablet 650 mg, 650 mg, Oral, Q4H PRN, Venus Standard, CNM .  calcium carbonate (TUMS - dosed in mg elemental calcium) chewable tablet 400 mg of elemental calcium, 2 tablet, Oral, Q4H PRN, Venus Standard, CNM .  docusate sodium (COLACE) capsule 100 mg, 100 mg, Oral, Daily, Venus Standard, CNM, 100 mg at 07/10/14 0959 .  ferrous sulfate tablet 325 mg, 325 mg, Oral, BID WC, Venus Standard, CNM, 325 mg at 07/10/14 2101 .  lactated ringers infusion, , Intravenous, Continuous, Everett Graff, MD, Last Rate: 125 mL/hr at 07/11/14 0345 .  prenatal multivitamin tablet 1 tablet, 1 tablet, Oral, Q1200, Venus Standard, CNM, 1 tablet at 07/10/14 0958 .  zolpidem (AMBIEN) tablet 5 mg, 5 mg, Oral, QHS PRN, Venus Standard, CNM, 5 mg at 07/09/14 0250  A: [redacted]w[redacted]d with oligohydramnios, elevated dopplers, breech presentation, GBS positive     stable    Fetal tracings: Category 2     Contractions: none     Uterus non-tender      Extremities: DTR 1+, no clonus,  Min. edema     BMZ com plete  P: Continue current plan of care     Continuous fetal monitoring      T&S q 72 hours      MDs will follow   Venus Standard, CNM, MSN 07/11/2014. 11:27 AM  I saw and examined patient at bedside and agree with above findings, assessment and plan.  EFM reviewed from 4 pm today: Baseline 125, moderate variability, reactive.  Intermittent variable decelerations. TOCO: No contractions. Continue with monitoring, positional changes.  Dr. Alesia Richards.

## 2014-07-11 NOTE — Progress Notes (Signed)
Avanelle Pixley [id number]  Subjective: Nurse call expressing concern regarding fetal tracing.  Reports patient with no pain, eating well, fluids running, and tilted on left side.  Strip and Chart Reviewed.  Objective:  Filed Vitals:   07/10/14 1935 07/10/14 2100 07/10/14 2200 07/11/14 0005  BP: 131/53   118/69  Pulse: 76   82  Temp: 98.8 F (37.1 C)   97 F (36.1 C)  TempSrc: Oral   Axillary  Resp: 18 18 18 18   Height:      Weight:      SpO2:        FHR: 135 bpm, Min/mod Var, +Prolonged Decels at 8184-0375, +Accels UC: None Graphed  Assessment: IUP at [redacted]w[redacted]d Cat II FT Severe IUGR Oligo Elevated Dopplers Failed Antenatal Testing  Plan: -Give 250 fluid bolus -Maintain left tilt -Contact provider with any other prolonged decelerations or changes in baseline -Continue other mgmt as ordered  Milinda Cave, CNM 07/11/2014 1:06 AM

## 2014-07-12 NOTE — Progress Notes (Signed)
A quick review of chart for potential AICU admission.

## 2014-07-12 NOTE — Progress Notes (Addendum)
Patient ID: Caroline Yang, female   DOB: [birth year], 34 y.o.   MRN: [id number]  Caroline Yang is a 34 y.o. G4P2002 at [redacted]w[redacted]d admitted for observation secondary to IUGR and Oligo with elevated dopplers  Subjective: No complaints.  Good FM, no vb, no ctxs and no LOF.  Objective: BP 105/77 mmHg  Pulse 58  Temp(Src) 98.2 F (36.8 C) (Oral)  Resp 18  Ht 5\' 2"  (1.575 m)  Wt 86.546 kg (190 lb 12.8 oz)  BMI 34.89 kg/m2  SpO2 97%  LMP 12/22/2013     Physical Exam:  Gen: alert Chest/Lungs: cta bilaterally  Heart/Pulse: RRR  Abdomen: soft, gravid, nontender Uterine fundus: soft, nontender Skin & Color: warm and dry  Neurological: AOx3, DTRs 2+ EXT: negative Homan's b/l, edema neg  FHT:  FHR: 120s-130s bpm, variability: moderate,  accelerations:  Present,  decelerations:  Absent UC:   none SVE:    n/a  Labs: Lab Results  Component Value Date   WBC 7.5 07/08/2014   HGB 9.7* 07/08/2014   HCT 29.4* 07/08/2014   MCV 78.4 07/08/2014   PLT 336 07/08/2014    Assessment and Plan: has IUGR (intrauterine growth restriction); Oligohydramnios; Positive GBS test; and Breech presentation on her problem list. Pt has had occasional decels but this morning tracing has been without decel and cat 1 Cont close observation BPP and dopplers on Monday  Febe Champa Y 07/12/2014, 10:47 AM

## 2014-07-12 NOTE — Progress Notes (Signed)
Hospital day # 4 pregnancy at [redacted]w[redacted]d--oligohydramnios, elevated dopplers, breech presentation, GBS positive.  S:  Perception of contractions: none      Vaginal bleeding: none now       Vaginal discharge:  no significant change  O: BP 105/77 mmHg  Pulse 58  Temp(Src) 98.2 F (36.8 C) (Oral)  Resp 18  Ht 5\' 2"  (1.575 m)  Wt 190 lb 12.8 oz (86.546 kg)  BMI 34.89 kg/m2  SpO2 97%  LMP 12/22/2013      Fetal tracings: 135, moderate variability, + accel, occasional decel      Contractions:  none       Uterus gravid and non-tender      Extremities: no significant edema and no signs of DVT          Labs:   Results for orders placed or performed during the hospital encounter of 07/08/14 (from the past 24 hour(s))  Type and screen     Status: None   Collection Time: 07/11/14  4:49 PM  Result Value Ref Range   ABO/RH(D) A POS    Antibody Screen NEG    Sample Expiration 07/14/2014          Meds:  Current facility-administered medications:  .  acetaminophen (TYLENOL) tablet 650 mg, 650 mg, Oral, Q4H PRN, Kamren Heskett, CNM .  calcium carbonate (TUMS - dosed in mg elemental calcium) chewable tablet 400 mg of elemental calcium, 2 tablet, Oral, Q4H PRN, Zion Ta, CNM .  docusate sodium (COLACE) capsule 100 mg, 100 mg, Oral, Daily, Olof Marcil, CNM, 100 mg at 07/12/14 1014 .  ferrous sulfate tablet 325 mg, 325 mg, Oral, BID WC, Dacari Beckstrand, CNM, 325 mg at 07/12/14 1014 .  guaiFENesin (MUCINEX) 12 hr tablet 600 mg, 600 mg, Oral, BID PRN, Waymon Amato, MD .  lactated ringers infusion, , Intravenous, Continuous, Everett Graff, MD, Last Rate: 125 mL/hr at 07/11/14 1139 .  prenatal multivitamin tablet 1 tablet, 1 tablet, Oral, Q1200, Jolyn Deshmukh, CNM, 1 tablet at 07/12/14 1014 .  zolpidem (AMBIEN) tablet 5 mg, 5 mg, Oral, QHS PRN, Jahzaria Vary, CNM, 5 mg at 07/09/14 0250  A: [redacted]w[redacted]d with oligohydramnios, elevated dopplers, breech presentation, GBS positive     stable     Fetal  tracings: over all reassured     Contractions: none     Uterus non-tender      Extremities: DTR 1+, no clonus, MIn. edema     BMZcomplete  P: Continue current plan of care      Upcoming tests/treatments:  T&S q 72 hours      Consult with Dr. Mancel Bale for plan on care      MDs will follow    Rodnisha Blomgren, CNM, MSN 07/12/2014. 10:16 AM

## 2014-07-13 ENCOUNTER — Inpatient Hospital Stay (HOSPITAL_COMMUNITY): Payer: Medicaid Other

## 2014-07-13 LAB — LUPUS ANTICOAGULANT PANEL
DRVVT: 38.7 s (ref 0.0–55.1)
PTT LA: 30.6 s (ref 0.0–50.0)

## 2014-07-13 NOTE — Progress Notes (Signed)
Call to review the strip by the care nurse.  Pt had a prolong decel at 1518-1522 down to th 90s with good recover to the baseline of 135.  The tracing has good variability and + accel with occasional variable decel.  As of 1536 to the present the tracing has been Category 1.

## 2014-07-13 NOTE — Progress Notes (Signed)
34 y.o. year old female,at [redacted]w[redacted]d gestation.  SUBJECTIVE:  The patient complains of pain from her IV line.  OBJECTIVE:  BP 130/72 mmHg  Pulse 71  Temp(Src) 98.2 F (36.8 C) (Oral)  Resp 20  Ht 5\' 2"  (1.575 m)  Wt 190 lb 12.8 oz (86.546 kg)  BMI 34.89 kg/m2  SpO2 97%  LMP 12/22/2013  Fetal Heart Tones:  Category 2 fetal heart rate tracing  Contractions:          None  Abdomen: Nontender Extremities: Edema, no masses  Ultrasound: Vertex, decreased fluid but needs the criteria for biophysical profile, normal Dopplers, BPP 6 out of 8 (negative fetal breathing movements)  ASSESSMENT:  [redacted]w[redacted]d Weeks Pregnancy  Intrauterine growth retardation  Oligohydramnios  Painful IV  Baby now vertex  PLAN:  Remove IV line  Continue hospital monitoring of the baby  Encourage hydration by mouth  Repeat BPP in 2 days  Gildardo Cranker, M.D.

## 2014-07-14 ENCOUNTER — Other Ambulatory Visit (HOSPITAL_COMMUNITY): Payer: Medicaid Other

## 2014-07-14 MED ORDER — CYCLOBENZAPRINE HCL 10 MG PO TABS: 10.0000 mg | ORAL_TABLET | Freq: Three times a day (TID) | ORAL | Status: DC | PRN

## 2014-07-14 NOTE — Progress Notes (Addendum)
Hospital day # 6 pregnancy at [redacted]w[redacted]d--IUGR, oligohydramnios, vtx on [place] 07/13/14  S:  C/o back soreness and lower abdominal pain only with movement, c/w ligament pain.  Husband at bedside.      Perception of contractions: None      Vaginal bleeding: None       Vaginal discharge:  None       Patient removes monitors to go to BR, then will delay replacing monitors.  O: BP 132/59 mmHg  Pulse 102  Temp(Src) 98.4 F (36.9 C) (Oral)  Resp 20  Ht 5\' 2"  (1.575 m)  Wt 86.546 kg (190 lb 12.8 oz)  BMI 34.89 kg/m2  SpO2 97%  LMP 12/22/2013      Fetal tracings:  Overall reassuring--segments of reactivity and accels, segments of decreased variability, occasional decel/baseline change, occasional quick, mild variable.      Contractions:   Occasional irritability      Uterus non-tender      Extremities: no significant edema and no signs of DVT          Labs:  T&S q 72 hours--last draw 5/9/`6       Meds:  . docusate sodium  100 mg Oral Daily  . ferrous sulfate  325 mg Oral BID WC  . prenatal multivitamin  1 tablet Oral Q1200   A: [redacted]w[redacted]d with IUGR, oligohydramnios, normal dopplers 07/13/14, GBS positive, vtx      Stable  P: Continue current plan of care      Upcoming tests/treatments:  BPP/AFI tomorrow      Reviewed plan of care with patient and husband.  Reviewed need for continuous EFM due to fetal issues of IUGR, oligohydramnios, and sporadic decels.      Flexeril 10 mg po TID prn back pain.      MDs will follow  Donnel Saxon CNM, [place] 07/14/2014 1:22 PM   Seen and agreed

## 2014-07-15 ENCOUNTER — Inpatient Hospital Stay (HOSPITAL_COMMUNITY): Payer: Medicaid Other

## 2014-07-15 MED ORDER — PANTOPRAZOLE SODIUM 20 MG PO TBEC
20.0000 mg | DELAYED_RELEASE_TABLET | Freq: Every day | ORAL | Status: DC
  Administered 2014-07-15 – 2014-07-29 (×15): 20 mg via ORAL
  Filled 2014-07-15 (×16): qty 1

## 2014-07-15 NOTE — Progress Notes (Addendum)
Hospital day # 7 pregnancy at [redacted]w[redacted]d-- IUGR and oligo.  S:  Perception of contractions: none      Vaginal bleeding: none now       Vaginal discharge:  no significant change  O: BP 131/50 mmHg  Pulse 91  Temp(Src) 98.7 F (37.1 C) (Oral)  Resp 18  Ht 5\' 2"  (1.575 m)  Wt 190 lb 12.8 oz (86.546 kg)  BMI 34.89 kg/m2  SpO2 97%  LMP 12/22/2013      Fetal tracings:135, moderate variability, no decels noted since returning from [place]      Contractions:  none       Uterus gravid and non-tender      Extremities: no significant edema and no signs of DVT          Labs:   Results for orders placed or performed during the hospital encounter of 07/08/14 (from the past 24 hour(s))  Type and screen     Status: None   Collection Time: 07/14/14  4:55 PM  Result Value Ref Range   ABO/RH(D) A POS    Antibody Screen NEG    Sample Expiration 07/17/2014          Meds:  Current facility-administered medications:  .  acetaminophen (TYLENOL) tablet 650 mg, 650 mg, Oral, Q4H PRN, Venus Standard, CNM .  calcium carbonate (TUMS - dosed in mg elemental calcium) chewable tablet 400 mg of elemental calcium, 2 tablet, Oral, Q4H PRN, Venus Standard, CNM .  cyclobenzaprine (FLEXERIL) tablet 10 mg, 10 mg, Oral, TID PRN, Donnel Saxon, CNM .  docusate sodium (COLACE) capsule 100 mg, 100 mg, Oral, Daily, Venus Standard, CNM, 100 mg at 07/14/14 1005 .  ferrous sulfate tablet 325 mg, 325 mg, Oral, BID WC, Venus Standard, CNM, 325 mg at 07/14/14 1709 .  guaiFENesin (MUCINEX) 12 hr tablet 600 mg, 600 mg, Oral, BID PRN, Waymon Amato, MD .  prenatal multivitamin tablet 1 tablet, 1 tablet, Oral, Q1200, Venus Standard, CNM, 1 tablet at 07/14/14 1005 .  zolpidem (AMBIEN) tablet 5 mg, 5 mg, Oral, QHS PRN, Venus Standard, CNM, 5 mg at 07/09/14 0250  A: [redacted]w[redacted]d with  IUGR and oligo     stable     Fetal tracings:      Contractions: none     Uterus non-tender      Extremities: DTR 1+, no clonus, none edema     [place] results  pending BMZ complete 5/4 & 5/5  P: Continue current plan of care      Upcoming tests/treatments:  [place] today      continuous monitoring      Consult with Dr. Mancel Bale for plan on care      Possible DC to home      MDs will follow     Venus Standard, CNM, MSN 07/15/2014. 7:51 AM  Addendum 7225 [place] report BPP 6/8 off for breathing, AFI 9.28, Dopplers WNL - no absent or reverse flow, cephalic  Recommendation Growth [place] in 2 weeks Biweekly dopplers.  It they remain stable can go to weekly dopplers Continue with antenatel testing   Repeat BPP in am.  Reviewed MFM recs with pt.  Questions answered.  Pt request medicine for GERD.  Protonix and tums ordered.

## 2014-07-16 ENCOUNTER — Inpatient Hospital Stay (HOSPITAL_COMMUNITY): Payer: Medicaid Other

## 2014-07-16 NOTE — Progress Notes (Signed)
Initial Nutrition Assessment    INTERVENTION:   (snacks TID, double protein portions if wished, may order from retail menu)  NUTRITION DIAGNOSIS:  Increased nutrient needs related to  (pregnancy and fetal growth requirements) as evidenced by  ([redacted] weeks gestation).    GOAL:  Patient will meet greater than or equal to 90% of their needs    MONITOR:  PO intake, Weight trends  REASON FOR ASSESSMENT: Antenatal       ASSESSMENT: 29 3/7 weeks IUP with IUGR and oglio. Has Hx of weight loss in early preg due to n/v. Currently tol diet well, appetite good and consumes 3 meals per day. Goal weight gain for pre-preg BMI is 11-20 lbs. Pt with a net 3 lb weight gain.   Height:  Ht Readings from Last 1 Encounters:  07/08/14 5\' 2"  (1.575 m)    Weight:  Wt Readings from Last 1 Encounters:  07/15/14 201 lb 8 oz (91.4 kg)    Ideal Body Weight:  50 kg  Wt Readings from Last 10 Encounters:  07/15/14 201 lb 8 oz (91.4 kg)  07/08/14 190 lb 8 oz (86.41 kg)  12/11/12 175 lb (79.379 kg)  11/27/12 174 lb 11.2 oz (79.243 kg)  10/30/12 172 lb (78.019 kg)  10/29/12 174 lb (78.926 kg)  10/02/12 180 lb 9.6 oz (81.92 kg)  04/22/12 187 lb (84.823 kg)  06/17/11 182 lb 9.6 oz (82.827 kg)    BMI:  Body mass index is 36.85 kg/(m^2).  Estimated Nutritional Needs:  Kcal:  2000-2200  Protein:  85-93   Fluid:  2.3 L   Diet Order:  Diet regular Room service appropriate?: Yes; Fluid consistency:: Thin  EDUCATION NEEDS:  No education needs identified at this time  No intake or output data in the 24 hours ending 07/16/14 [place].Fredderick Severance LDN Neonatal Nutrition Support Specialist/RD III Pager [phone]

## 2014-07-16 NOTE — Progress Notes (Addendum)
Hospital day # 8 pregnancy at [redacted]w[redacted]d--IUGR, oligohydramnios, dopplers previously abnormal, currently WNL  S:  Doing well--awaiting today's BPP      Perception of contractions: None      Vaginal bleeding: None       Vaginal discharge:  None  O: BP 120/66 mmHg  Pulse 88  Temp(Src) 98.4 F (36.9 C) (Oral)  Resp 18  Ht 5\' 2"  (1.575 m)  Wt 91.4 kg (201 lb 8 oz)  BMI 36.85 kg/m2  SpO2 97%  LMP 12/22/2013      Fetal tracings:  Category 1 overall, occasional mild variables      Contractions:  None       Uterus non-tender      Extremities: no significant edema and no signs of DVT          Labs:  T&S current q 3 days, done 07/14/14  BPP yesterday:  6/8 (breathing intermittently, but not sustained), normal dopplers, AFI 9.28, 6%ile, subjectively decreased, vtx.       Meds:  . docusate sodium  100 mg Oral Daily  . ferrous sulfate  325 mg Oral BID WC  . pantoprazole  20 mg Oral Daily  . prenatal multivitamin  1 tablet Oral Q1200    A: [redacted]w[redacted]d with IUGR, oligohydramnios     Stable  P: Continue current plan of care      Upcoming tests/treatments:  Repeat BPP today      MFM recommendations from 07/15/14:    [place] for growth in 2 weeks  Twice weekly Doppler studies--if become stable, can move to  weekly             Continuous EFM                   MDs will follow  Donnel Saxon CNM, MN 07/16/2014 9:07 AM  Addendum: BPP 8/8, AFI 10.89, 20%ile, transverse--head to maternal right  Continue with continuous EFM.  Donnel Saxon, CNM 07/16/14 2:45p  Agree with above, cont observation

## 2014-07-16 NOTE — Progress Notes (Signed)
Caroline Yang [id number]  Subjective: Nurse call reporting decreased variability during baseline change s/p prolonged deceleration.  Strip and Chart Reviewed.    Objective:  Filed Vitals:   07/15/14 1629 07/15/14 1954 07/15/14 2056 07/15/14 2200  BP: 118/76 130/72    Pulse: 96 100    Temp: 98.4 F (36.9 C) 99.6 F (37.6 C)    TempSrc: Oral Oral    Resp: 20 20 18 18   Height:      Weight:      SpO2:        FHR: 9678-9381; 110 bpm, Mod Var, -Decels, +Accels [phone]: Increasing heart rate, but min variability Currently: 155 bpm, Mod Var, -Decels, -Accels UC: None Graphed  Assessment: IUP at [redacted]w[redacted]d Cat I FT IUGR  Plan: -No new orders at current as infant recovered spontaneously -Continue other mgmt as ordered  Milinda Cave, CNM 07/16/2014 2:11 AM

## 2014-07-17 LAB — TYPE AND SCREEN
ABO/RH(D): A POS
ABO/RH(D): A POS
Antibody Screen: NEGATIVE
Antibody Screen: NEGATIVE

## 2014-07-17 NOTE — Progress Notes (Signed)
Kyesha Balla [id number]  Subjective: Nurse requests review of strip.  States baseline change.  Strip and Chart Reviewed.  Objective:  Filed Vitals:   07/16/14 2015 07/16/14 2100 07/16/14 2312 07/17/14 0035  BP: 136/82     Pulse: 103   81  Temp: 98.1 F (36.7 C)     TempSrc: Oral     Resp: 20 20 20    Height:      Weight:      SpO2:    97%    FHR: 120 bpm, Mod Var, -Decels, -Accels UC: None Graphed  Assessment: IUP at [redacted]w[redacted]d Cat I FT IUGR  Plan: -Nurse informed baseline change normal -Dr. Chauncey Cruel. [place] in house, strip reviewed, agrees reassuring -Continue mgmt as ordered  Milinda Cave, CNM 07/17/2014 1:02 AM

## 2014-07-17 NOTE — Progress Notes (Addendum)
Hospital day # 9 pregnancy at [redacted]w[redacted]d--IUGR, oligohydramnios, dopplers previously abnormal, currently WNL   S:  Perception of contractions: none      Vaginal bleeding: none now       Vaginal discharge:  no significant change  O: BP 136/82 mmHg  Pulse 112  Temp(Src) 98.1 F (36.7 C) (Oral)  Resp 20  Ht 5\' 2"  (1.575 m)  Wt 201 lb 8 oz (91.4 kg)  BMI 36.85 kg/m2  SpO2 97%  LMP 12/22/2013      Fetal tracings:135, moderate variability, + accel, occasional variable decel      Contractions:  none       Uterus gravid and non-tender      Extremities: no significant edema and no signs of DVT          Labs:  No results found for this or any previous visit (from the past 24 hour(s)).       Meds:  Current facility-administered medications:  .  acetaminophen (TYLENOL) tablet 650 mg, 650 mg, Oral, Q4H PRN, Lundy Cozart, CNM .  calcium carbonate (TUMS - dosed in mg elemental calcium) chewable tablet 400 mg of elemental calcium, 2 tablet, Oral, Q4H PRN, Toree Edling, CNM .  cyclobenzaprine (FLEXERIL) tablet 10 mg, 10 mg, Oral, TID PRN, Donnel Saxon, CNM .  docusate sodium (COLACE) capsule 100 mg, 100 mg, Oral, Daily, Merick Kelleher, CNM, 100 mg at 07/16/14 0940 .  ferrous sulfate tablet 325 mg, 325 mg, Oral, BID WC, Iver Miklas, CNM, 325 mg at 07/16/14 2057 .  guaiFENesin (MUCINEX) 12 hr tablet 600 mg, 600 mg, Oral, BID PRN, Waymon Amato, MD .  pantoprazole (PROTONIX) EC tablet 20 mg, 20 mg, Oral, Daily, Everett Graff, MD, 20 mg at 07/16/14 0940 .  prenatal multivitamin tablet 1 tablet, 1 tablet, Oral, Q1200, Lord Lancour, CNM, 1 tablet at 07/16/14 1533 .  zolpidem (AMBIEN) tablet 5 mg, 5 mg, Oral, QHS PRN, Jhonathan Desroches, CNM, 5 mg at 07/09/14 0250  A: [redacted]w[redacted]d with IUGR, oligohydramnios, dopplers previously abnormal, currently WNL     stable     Fetal tracings: over all reassuring     Contractions: none     Uterus non-tender      Extremities: DTR 1+, no clonus, no edema   P: Continue  current plan of care      Upcoming tests/treatments:    [place] for growth in 2 weeks Twice weekly Doppler studies--if become stable, can move to weekly  Continuous EFM       MDs will follow  Jamerica Snavely, CNM, MSN 07/17/2014. 7:20 AM     Addendum Pr Dr Mancel Bale AFI, BPP and doppler study every Monday and Thursday

## 2014-07-18 NOTE — Progress Notes (Signed)
Patient ID: [name], female   DOB: 12-22-1980, 34 y.o.   MRN: [id number] Aubreanna Percle is a 34 y.o. G4P2002 at [redacted]w[redacted]d admitted for IUGR and OLIGO   Subjective: Reports good FM, no VB and no CTXs.  PT had asked earlier in the week about going home.  Denies HA, visual changes and generally has no complaints.  Objective: BP 129/60 mmHg  Pulse 83  Temp(Src) 99 F (37.2 C) (Oral)  Resp 20  Ht 5\' 2"  (1.575 m)  Wt 91.4 kg (201 lb 8 oz)  BMI 36.85 kg/m2  SpO2 98%  LMP 12/22/2013      Physical Exam:  Gen: alert and no distress Chest/Lungs: cta bilaterally  Heart/Pulse: RRR  Abdomen: soft, gravid, nontender, BX x4 quad Uterine fundus: soft, nontender Skin & Color: warm and dry  Neurological: AOx3, DTRs 2+ EXT: negative Homan's b/l, edema neg  FHT:  120s-130s, mod variability and + accels, no decels UC:   none SVE:    deferred  Labs: Lab Results  Component Value Date   WBC 7.5 07/08/2014   HGB 9.7* 07/08/2014   HCT 29.4* 07/08/2014   MCV 78.4 07/08/2014   PLT 336 07/08/2014   GBS +  Assessment and Plan: has IUGR (intrauterine growth restriction); Oligohydramnios; Positive GBS test; Breech presentation; and [redacted] weeks gestation of pregnancy on her problem list.  Per my discussion with Dr. Lawerance Cruel, MFM, earlier this week.  If pt remains with good testing, consider managing as out patient.  Pt needs BPP, AFI and dopplers on Mondays and Thursdays.  She is due for growth with testing on the 26th.  It BPP 8/8, nl fluid and nl dopplers on both Mon and Thurs this week consider out patient management.  Per MFM if that continues to be good with adequate growth, may consider testing once a week.  I discussed this with patient and questions answered.  Will continue current management.  Currently on continuous monitoring. Fetal status is reassuring with cat 1 tracing. Taevon Aschoff Y 07/18/2014, 11:03 AM

## 2014-07-19 NOTE — Progress Notes (Signed)
Patient ID: Caroline Yang, female   DOB: [birth year], 34 y.o.   MRN: [id number] Pt without complaints.  No leakage of fluid or VB.  Good FM  BP 110/68 mmHg  Pulse 97  Temp(Src) 98.4 F (36.9 C) (Oral)  Resp 20  Ht 5\' 2"  (1.575 m)  Wt 201 lb 8 oz (91.4 kg)  BMI 36.85 kg/m2  SpO2 98%  LMP 12/22/2013  FHTS 135-140 LTV  Toco none  Pt in NAD CV RRR Lungs CTAB abd  Gravid soft and NT GU no vb EXt no calf tenderness Results for orders placed or performed during the hospital encounter of 07/08/14 (from the past 72 hour(s))  Type and screen     Status: None   Collection Time: 07/17/14  3:50 PM  Result Value Ref Range   ABO/RH(D) A POS    Antibody Screen NEG    Sample Expiration 07/20/2014     Assessment and Plan [redacted]w[redacted]d  IUGR with OLIGO and abnormal dopplers All are improving.   If pt still continues to have reassuring testing she may be followed as an outpatient

## 2014-07-20 ENCOUNTER — Inpatient Hospital Stay (HOSPITAL_COMMUNITY): Payer: Medicaid Other

## 2014-07-20 LAB — TYPE AND SCREEN
ABO/RH(D): A POS
Antibody Screen: NEGATIVE

## 2014-07-20 NOTE — Progress Notes (Signed)
Hospital day # 12 pregnancy at [redacted]w[redacted]d; IUGR with elevated dopplers ( normalized) and oligohydramnios ( resolved)  S: well, reports good fetal activity      Contractions:none      Vaginal bleeding:none now       Vaginal discharge: no significant change  O: BP 113/63 mmHg  Pulse 91  Temp(Src) 98.4 F (36.9 C) (Oral)  Resp 20  Ht 5\' 2"  (1.575 m)  Wt 201 lb 8 oz (91.4 kg)  BMI 36.85 kg/m2  SpO2 98%  LMP 12/22/2013      Fetal tracings:baseline 125-135 with good variability. Sporadic prolonged variable decelerations, non-repetitive and  often positional. Overall reassuring      Uterus non-tender      Extremities: no significant edema and no signs of DVT  A: [redacted]w[redacted]d with IUGR     unchanged  P: Dopplers 5/16 and 5/19: if normal will plan outpatient management     Next growth ultrasound 07/30/14  Deepti Gunawan A  MD 07/20/2014 8:32 AM

## 2014-07-20 NOTE — Progress Notes (Signed)
Back on pt after her AM care

## 2014-07-21 ENCOUNTER — Ambulatory Visit (HOSPITAL_COMMUNITY): Payer: Medicaid Other

## 2014-07-21 NOTE — Progress Notes (Signed)
Hospital day # 13 pregnancy at [redacted]w[redacted]d--IUGR, hx elevated dopplers, currently normal, and oligohydramnios  S:  Doing well, denies HA, visual sx, or epigastric pain.  Aware of potential for d/c later this week if BPP/dopplers remain WNL.      Perception of contractions: none, None      Vaginal bleeding: None       Vaginal discharge:  None  O: BP 112/77 mmHg  Pulse 101  Temp(Src) 98.4 F (36.9 C) (Oral)  Resp 18  Ht 5\' 2"  (1.575 m)  Wt 91.4 kg (201 lb 8 oz)  BMI 36.85 kg/m2  SpO2 98%  LMP 12/22/2013      Fetal tracings:  Sporadic accelerations, 2 prolonged decels (0216 and 0724, cycles of moderate variability--overall reassuring       Contractions:   None      Uterus non-tender      Extremities: no significant edema and no signs of DVT          Labs:   Results for orders placed or performed during the hospital encounter of 07/08/14 (from the past 24 hour(s))  Type and screen     Status: None   Collection Time: 07/20/14  5:28 PM  Result Value Ref Range   ABO/RH(D) A POS    Antibody Screen NEG    Sample Expiration 07/23/2014           Meds:  . docusate sodium  100 mg Oral Daily  . ferrous sulfate  325 mg Oral BID WC  . pantoprazole  20 mg Oral Daily  . prenatal multivitamin  1 tablet Oral Q1200    [place] 07/20/14:  BPP 6/8 (no FBM), breech, AFI 7.82, <3%ile, normal dopplers.   A: [redacted]w[redacted]d with IUGR, oligohydramnios, normal dopplers     Stable  P: Continue current plan of care      Upcoming tests/treatments:  Repeat BPP and dopplers on Thursday, 5/19.  If normal, will be candidate for outpatient management. Next growth [place] 07/30/14--already scheduled with MFM      MDs will follow  Donnel Saxon CNM, [place] 07/21/2014 11:00 AM

## 2014-07-22 MED ORDER — ONDANSETRON 4 MG PO TBDP
4.0000 mg | ORAL_TABLET | Freq: Three times a day (TID) | ORAL | Status: DC | PRN
  Administered 2014-07-22 – 2014-07-28 (×4): 4 mg via ORAL
  Filled 2014-07-22 (×5): qty 1

## 2014-07-22 MED ORDER — ONDANSETRON 4 MG PO TBDP
4.0000 mg | ORAL_TABLET | Freq: Once | ORAL | Status: AC
  Administered 2014-07-22: 4 mg via ORAL
  Filled 2014-07-22: qty 1

## 2014-07-22 MED ORDER — LACTATED RINGERS IV BOLUS (SEPSIS)
500.0000 mL | Freq: Once | INTRAVENOUS | Status: AC
  Administered 2014-07-22: 500 mL via INTRAVENOUS

## 2014-07-22 MED ORDER — LACTATED RINGERS IV SOLN: INTRAVENOUS | Status: DC

## 2014-07-22 MED ORDER — MECLIZINE HCL 25 MG PO TABS
25.0000 mg | ORAL_TABLET | Freq: Three times a day (TID) | ORAL | Status: DC | PRN
  Administered 2014-07-22 – 2014-07-28 (×4): 25 mg via ORAL
  Filled 2014-07-22 (×7): qty 1

## 2014-07-22 NOTE — Progress Notes (Addendum)
RN called to notify of patient vomiting.  Had been dizzy earlier, now vomited x 1.  Received Antivert at 1752, vomited at 1802.  Filed Vitals:   07/22/14 0938 07/22/14 1020 07/22/14 1215 07/22/14 1642  BP: 129/70  114/53 104/64  Pulse: 110  99 89  Temp: 98.2 F (36.8 C)  98.1 F (36.7 C) 98.4 F (36.9 C)  TempSrc: Oral  Oral Oral  Resp: 20  18 20   Height:      Weight:  84.732 kg (186 lb 12.8 oz)    SpO2:       FHR overall Category 1--? decel with vomiting, but FHR reactive before and after.  Zofran 4 mg ODT ordered.  If vomiting persists, will start IV line for hydration and antiemetics.  Donnel Saxon, CNM 5/1  I saw and examined patient an agree with above findings, assessment and plan. Patient has not taken antivert yet, try another zofran then antivert.  Dr. Alesia Richards. 8/16 1840

## 2014-07-22 NOTE — Progress Notes (Signed)
Irena Reichmann, CNM notified that EFM presented a prolong deceleration and that patient had turned to lie on back. Pt repositioned back to left lateral side. Will continue to monitor.

## 2014-07-22 NOTE — Progress Notes (Addendum)
TC from patient's primary care nurse x 2 re: late decels during the night (x 2 occasions) with slow return to baseline. Minimal variability also noted. Intrauterine resuscitative measures taken. Pt w/ IUGR and oligo.   Appreciated call from Angelina Pih, RN for update.   Farrel Gordon, CMM  07/22/14, 0730 AM

## 2014-07-22 NOTE — Progress Notes (Signed)
Hospital day # 14 pregnancy at [redacted]w[redacted]d--Severe IUGR, oligohydramnios.  S:  Mild dizziness today--denies N/V, no fever, no sinus congestion      Also concerned about weight loss--reports previous weight 200, now 186.      Perception of contractions: None      Vaginal bleeding: None      Vaginal discharge:  None  O: BP 114/53 mmHg  Pulse 99  Temp(Src) 98.1 F (36.7 C) (Oral)  Resp 18  Ht 5\' 2"  (1.575 m)  Wt 84.732 kg (186 lb 12.8 oz)  BMI 34.16 kg/m2  SpO2 98%  LMP 12/22/2013      Fetal tracings:  Category 1 overall--single prolonged decel at 1130, but reactive before and after.      Contractions:   Occasional, mild      Uterus non-tender      Extremities: no significant edema and no signs of DVT       No sinus pain/pressure, no ear pain or pressure.  Weight on admission:  86.41 Kg Weight today:   84.73          Labs: NA       Meds:  . docusate sodium  100 mg Oral Daily  . ferrous sulfate  325 mg Oral BID WC  . pantoprazole  20 mg Oral Daily  . prenatal multivitamin  1 tablet Oral Q1200    A: [redacted]w[redacted]d with severe IUGR, oligohydramnios     Stable     Dizziness     Weight gain plateau  P: Continue current plan of care      Nutritional consult      Push fluids      Antivert trial      Upcoming tests/treatments:  Doppler study tomorrow      MDs will follow  Donnel Saxon CNM, [place] 07/22/2014 3:01 PM

## 2014-07-22 NOTE — Progress Notes (Signed)
Pt c/o nausea & observed vomiting

## 2014-07-23 ENCOUNTER — Ambulatory Visit (HOSPITAL_COMMUNITY): Payer: Medicaid Other

## 2014-07-23 LAB — TYPE AND SCREEN
ABO/RH(D): A POS
Antibody Screen: NEGATIVE

## 2014-07-23 NOTE — Progress Notes (Addendum)
Hospital day # 15 pregnancy at [redacted]w[redacted]d--Severe IUGR, oligohydramnios..  S:  Perception of contractions: none      Vaginal bleeding: none now       Vaginal discharge:  no significant change  O: BP 122/74 mmHg  Pulse 91  Temp(Src) 98.3 F (36.8 C) (Oral)  Resp 20  Ht 5\' 2"  (1.575 m)  Wt 186 lb 12.8 oz (84.732 kg)  BMI 34.16 kg/m2  SpO2 98%  LMP 12/22/2013      Fetal tracings:135, moderate variability, + accel, no decel      Contractions:  none       Uterus gravid and non-tender      Extremities: no significant edema and no signs of DVT    [place] IUGR. WFE 239g - <10th %, doppler wnl, BPP 6/8 - breathing, breech, AFI 3.38        Labs:   Results for orders placed or performed during the hospital encounter of 07/08/14 (from the past 24 hour(s))  Type and screen     Status: None (Preliminary result)   Collection Time: 07/23/14  5:02 PM  Result Value Ref Range   ABO/RH(D) A POS    Antibody Screen PENDING    Sample Expiration 07/26/2014          Meds:  Current facility-administered medications:  .  acetaminophen (TYLENOL) tablet 650 mg, 650 mg, Oral, Q4H PRN, Venus Standard, CNM .  calcium carbonate (TUMS - dosed in mg elemental calcium) chewable tablet 400 mg of elemental calcium, 2 tablet, Oral, Q4H PRN, Venus Standard, CNM .  cyclobenzaprine (FLEXERIL) tablet 10 mg, 10 mg, Oral, TID PRN, Donnel Saxon, CNM .  docusate sodium (COLACE) capsule 100 mg, 100 mg, Oral, Daily, Venus Standard, CNM, 100 mg at 07/23/14 1129 .  ferrous sulfate tablet 325 mg, 325 mg, Oral, BID WC, Venus Standard, CNM, 325 mg at 07/23/14 1235 .  guaiFENesin (MUCINEX) 12 hr tablet 600 mg, 600 mg, Oral, BID PRN, Waymon Amato, MD .  lactated ringers infusion, , Intravenous, Continuous, Gavin Pound, CNM, Last Rate: 125 mL/hr at 07/22/14 2015 .  meclizine (ANTIVERT) tablet 25 mg, 25 mg, Oral, TID PRN, Donnel Saxon, CNM, 25 mg at 07/22/14 1752 .  ondansetron (ZOFRAN-ODT) disintegrating tablet 4 mg, 4 mg, Oral, Q8H PRN,  Donnel Saxon, CNM, 4 mg at 07/22/14 1818 .  pantoprazole (PROTONIX) EC tablet 20 mg, 20 mg, Oral, Daily, Everett Graff, MD, 20 mg at 07/23/14 1129 .  prenatal multivitamin tablet 1 tablet, 1 tablet, Oral, Q1200, Venus Standard, CNM, 1 tablet at 07/22/14 0938 .  zolpidem (AMBIEN) tablet 5 mg, 5 mg, Oral, QHS PRN, Venus Standard, CNM, 5 mg at 07/09/14 0250  A: [redacted]w[redacted]d with Severe IUGR, oligohydramnios.     stable     Fetal tracings: Category 1     Contractions: none     Uterus non-tender      Extremities: DTR 1+, no clonus, no edema  P: Continue current plan of care      Upcoming tests/treatments:  Per Dr MFM repeat BPP in the am, doppler 2x week, afi next week      Consult with Dr.  for plan on care      MDs will follow  Venus Standard, CNM, MSN 07/23/2014. 6:40 PM  I saw and examined patient at bedside and agree with above findings, assessment and plan. Dr. Alesia Richards.

## 2014-07-23 NOTE — Progress Notes (Signed)
Nutrition Follow-up : concern for documented 15 lb weight loss in 1 week, IUGR concerns  Pt reports having good appetite and consuming 3 meals per day, except for yesterday when she experienced vomiting. Reinforced option to order snacks TID to supplement meals. She refused option of Ensure.   RN to reweight pt on standing scale, as bed scales are often inaccurate. Would be rather unusual for a pt to lose 15 lbs in one week unless critically ill or vomiting with no po intake all week.  Weyman Rodney M.Fredderick Severance LDN Neonatal Nutrition Support Specialist/RD III Pager [phone]      Phone [phone]

## 2014-07-24 ENCOUNTER — Ambulatory Visit (HOSPITAL_COMMUNITY): Payer: Medicaid Other

## 2014-07-24 ENCOUNTER — Inpatient Hospital Stay (HOSPITAL_COMMUNITY): Payer: Medicaid Other

## 2014-07-24 MED ORDER — LACTATED RINGERS IV BOLUS (SEPSIS)
500.0000 mL | Freq: Once | INTRAVENOUS | Status: AC
  Administered 2014-07-24: 500 mL via INTRAVENOUS

## 2014-07-24 MED ORDER — SODIUM CHLORIDE 0.9 % IJ SOLN
3.0000 mL | Freq: Two times a day (BID) | INTRAMUSCULAR | Status: DC
  Administered 2014-07-24 – 2014-07-25 (×2): 3 mL via INTRAVENOUS

## 2014-07-24 NOTE — Progress Notes (Signed)
@   1320 decel noted to be returning to baseline.  Baseline increase to 155 after decel before returning to 135.

## 2014-07-24 NOTE — Progress Notes (Signed)
Pt c/o nausea & dizziness, given meds for each complaint

## 2014-07-24 NOTE — Progress Notes (Addendum)
Hospital day # 16 pregnancy at [redacted]w[redacted]d--Severe IUGR, oligohydramnios..  S:  Perception of contractions: none      Vaginal bleeding: none now       Vaginal discharge:  no significant change  O: BP 109/54 mmHg  Pulse 95  Temp(Src) 98.2 F (36.8 C) (Oral)  Resp 20  Ht 5\' 2"  (1.575 m)  Wt 186 lb 12.8 oz (84.732 kg)  BMI 34.16 kg/m2  SpO2 97%  LMP 12/22/2013      Fetal tracings:155, moderate variability, occasional accel, coming off a decel after going to the bathroom       Contractions:  none       Uterus gravid and non-tender      Extremities: no significant edema and no signs of DVT          Labs:   Results for orders placed or performed during the hospital encounter of 07/08/14 (from the past 24 hour(s))  Type and screen     Status: None   Collection Time: 07/23/14  5:02 PM  Result Value Ref Range   ABO/RH(D) A POS    Antibody Screen NEG    Sample Expiration 07/26/2014          Meds:  Current facility-administered medications:  .  acetaminophen (TYLENOL) tablet 650 mg, 650 mg, Oral, Q4H PRN, Jennyfer Nickolson, CNM .  calcium carbonate (TUMS - dosed in mg elemental calcium) chewable tablet 400 mg of elemental calcium, 2 tablet, Oral, Q4H PRN, Naithen Rivenburg, CNM .  cyclobenzaprine (FLEXERIL) tablet 10 mg, 10 mg, Oral, TID PRN, Donnel Saxon, CNM .  docusate sodium (COLACE) capsule 100 mg, 100 mg, Oral, Daily, Addam Goeller, CNM, 100 mg at 07/24/14 0939 .  ferrous sulfate tablet 325 mg, 325 mg, Oral, BID WC, Eann Cleland, CNM, 325 mg at 07/24/14 0940 .  guaiFENesin (MUCINEX) 12 hr tablet 600 mg, 600 mg, Oral, BID PRN, Waymon Amato, MD .  lactated ringers infusion, , Intravenous, Continuous, Gavin Pound, CNM, Last Rate: 125 mL/hr at 07/22/14 2015 .  meclizine (ANTIVERT) tablet 25 mg, 25 mg, Oral, TID PRN, Donnel Saxon, CNM, 25 mg at 07/22/14 1752 .  ondansetron (ZOFRAN-ODT) disintegrating tablet 4 mg, 4 mg, Oral, Q8H PRN, Donnel Saxon, CNM, 4 mg at 07/22/14 1818 .  pantoprazole  (PROTONIX) EC tablet 20 mg, 20 mg, Oral, Daily, Everett Graff, MD, 20 mg at 07/24/14 0940 .  prenatal multivitamin tablet 1 tablet, 1 tablet, Oral, Q1200, Mahima Hottle, CNM, 1 tablet at 07/24/14 0939 .  zolpidem (AMBIEN) tablet 5 mg, 5 mg, Oral, QHS PRN, Madelina Sanda, CNM, 5 mg at 07/09/14 0250  A: [redacted]w[redacted]d with Severe IUGR, oligohydramnios..     stable     Fetal tracings: Category 2     Contractions: none     Uterus non-tender      Extremities: DTR 1+, no clonus, no edema  P: Continue current plan of care      Upcoming tests/treatments:  growth ultrasound 07/30/14, dopplers 2x week      Consult with Dr.  for plan on care      MDs will follow  Terilynn Buresh, CNM, MSN 07/24/2014. 1:49 PM

## 2014-07-25 NOTE — Progress Notes (Signed)
Hospital day # 17 pregnancy at [redacted]w[redacted]d--Severe IUGR, oligohydramnios.. S:  Perception of contractions: none      Vaginal bleeding: none now       Vaginal discharge:  no significant change  O: BP 113/59 mmHg  Pulse 85  Temp(Src) 98.2 F (36.8 C) (Oral)  Resp 20  Ht 5\' 2"  (1.575 m)  Wt 186 lb 12.8 oz (84.732 kg)  BMI 34.16 kg/m2  SpO2 97%  LMP 12/22/2013      Fetal tracings:135, moderate variability, + accel, no decels, over all reassured      Contractions:  none       Uterus gravid and non-tender      Extremities: no significant edema and no signs of DVT          Labs:  No results found for this or any previous visit (from the past 24 hour(s)).       Meds:  Current facility-administered medications:  .  acetaminophen (TYLENOL) tablet 650 mg, 650 mg, Oral, Q4H PRN, Mahnoor Mathisen, CNM .  calcium carbonate (TUMS - dosed in mg elemental calcium) chewable tablet 400 mg of elemental calcium, 2 tablet, Oral, Q4H PRN, Wayne Brunker, CNM .  cyclobenzaprine (FLEXERIL) tablet 10 mg, 10 mg, Oral, TID PRN, Donnel Saxon, CNM .  docusate sodium (COLACE) capsule 100 mg, 100 mg, Oral, Daily, Krishauna Schatzman, CNM, 100 mg at 07/25/14 0930 .  ferrous sulfate tablet 325 mg, 325 mg, Oral, BID WC, Savannaha Stonerock, CNM, 325 mg at 07/25/14 0929 .  guaiFENesin (MUCINEX) 12 hr tablet 600 mg, 600 mg, Oral, BID PRN, Waymon Amato, MD .  lactated ringers infusion, , Intravenous, Continuous, Gavin Pound, CNM, Last Rate: 125 mL/hr at 07/22/14 2015 .  meclizine (ANTIVERT) tablet 25 mg, 25 mg, Oral, TID PRN, Donnel Saxon, CNM, 25 mg at 07/24/14 1555 .  ondansetron (ZOFRAN-ODT) disintegrating tablet 4 mg, 4 mg, Oral, Q8H PRN, Donnel Saxon, CNM, 4 mg at 07/24/14 1555 .  pantoprazole (PROTONIX) EC tablet 20 mg, 20 mg, Oral, Daily, Everett Graff, MD, 20 mg at 07/25/14 0930 .  prenatal multivitamin tablet 1 tablet, 1 tablet, Oral, Q1200, Kamaljit Hizer, CNM, 1 tablet at 07/25/14 0929 .  sodium chloride 0.9 % injection 3 mL,  3 mL, Intravenous, Q12H, Gavin Pound, CNM, 3 mL at 07/25/14 0930 .  zolpidem (AMBIEN) tablet 5 mg, 5 mg, Oral, QHS PRN, Sitara Cashwell, CNM, 5 mg at 07/09/14 0250  A: [redacted]w[redacted]d with Severe IUGR, oligohydramnios..     stable     Fetal tracings: reassured     Contractions: none     Uterus non-tender      Extremities: DTR 1+, no clonus, no edema  P: Continue current plan of care      Upcoming tests/treatments:  doppler 2x week, AFI next week      MDs will follow  Gerren Hoffmeier, CNM, MSN 07/25/2014. 10:41 AM

## 2014-07-25 NOTE — Progress Notes (Signed)
Tracing sketchy, not tracing well

## 2014-07-25 NOTE — Progress Notes (Signed)
Baseline change noted, baseline was 135 with mod variability & both 10x10 & 15x15 accels

## 2014-07-26 LAB — TYPE AND SCREEN
ABO/RH(D): A POS
ANTIBODY SCREEN: NEGATIVE

## 2014-07-26 NOTE — Progress Notes (Signed)
Hospital day # 18 pregnancy at [redacted]w[redacted]d--Severe IUGR, oligohydramnios.  Subjective: Patient reports no complaints.  She feels normal fetal movement.   Objective: I have reviewed patient's vital signs. Filed Vitals:   07/26/14 0006 07/26/14 1151 07/26/14 1155 07/26/14 1228  BP: 113/62 119/72  119/63  Pulse: 96 100  101  Temp: 98.7 F (37.1 C)  98.5 F (36.9 C) 98.2 F (36.8 C)  TempSrc: Oral  Oral Oral  Resp: 20   18  Height:      Weight:      SpO2:        General: alert, cooperative and no distress Resp: clear to auscultation bilaterally Cardio: regular rate and rhythm, S1, S2 normal, no murmur, click, rub or gallop GI: soft, non-tender; bowel sounds normal; no masses,  no organomegaly Extremities: extremities normal, atraumatic, no cyanosis or edema  NST: 5/22@ 1540: 140 baseline, mod variability, reactive.  TOCO: No contractions.   Assessment/Plan:  Pregnancy at [redacted]w[redacted]d--Severe IUGR, oligohydramnios.  Stable -continue with current monitoring.  - Upcoming tests/treatments: doppler 2x week, AFI next week  LOS: 18 days    [facility] 07/26/2014, 4:16 PM

## 2014-07-27 MED ORDER — LACTATED RINGERS IV BOLUS (SEPSIS)
500.0000 mL | Freq: Once | INTRAVENOUS | Status: AC
  Administered 2014-07-27: 500 mL via INTRAVENOUS

## 2014-07-27 MED ORDER — SODIUM CHLORIDE 0.9 % IJ SOLN
3.0000 mL | Freq: Two times a day (BID) | INTRAMUSCULAR | Status: DC
  Administered 2014-07-27 – 2014-07-29 (×5): 3 mL via INTRAVENOUS

## 2014-07-27 NOTE — Progress Notes (Signed)
Pt off EFM from 0932-1006, up to BR doing am care.

## 2014-07-27 NOTE — Progress Notes (Signed)
CSW met with patient to complete assessment and offer support.  CSW also spoke to FOB regarding not leaving children with patient without another adult present.  He was understanding.  Full documentation to follow.

## 2014-07-27 NOTE — Progress Notes (Signed)
FHR baseline trending downward.

## 2014-07-27 NOTE — Progress Notes (Signed)
Caroline Yang [id number]  Subjective: Nurse call and report prolonged deceleration. Strip and Chart Reviewed.  Objective:  Filed Vitals:   07/26/14 2100 07/26/14 2200 07/26/14 2300 07/27/14 0243  BP:    114/60  Pulse:    93  Temp:      TempSrc:      Resp: 18 18 18 18   Height:      Weight:      SpO2:        FHR: 135 bpm, Mod Var, Prolonged Decels, +Accels UC: None Graphed  Assessment: IUP at [redacted]w[redacted]d Cat II FT IUGR  Plan: -Position change to resolve Cat II FT -Start IV give LR bolus of 561ml -Continue to monitor -Report as necessary   Milinda Cave, CNM 07/27/2014 3:37 AM

## 2014-07-27 NOTE — Progress Notes (Signed)
Patient ID: [name], female   DOB: October 05, 1980, 34 y.o.   MRN: [id number] Pt without complaints.  No leakage of fluid or VB.  Good FM  BP 103/61 mmHg  Pulse 94  Temp(Src) 98.2 F (36.8 C) (Oral)  Resp 20  Ht 5\' 2"  (1.575 m)  Wt 186 lb 12.8 oz (84.732 kg)  BMI 34.16 kg/m2  SpO2 97%  LMP 12/22/2013  FHTS Baseline: 120 Category 1 bpm  Toco none  Pt in NAD CV RRR Lungs CTAB abd  Gravid soft and NT GU no vb EXt no calf tenderness Results for orders placed or performed during the hospital encounter of 07/08/14 (from the past 72 hour(s))  Type and screen     Status: None   Collection Time: 07/26/14  5:13 PM  Result Value Ref Range   ABO/RH(D) A POS    Antibody Screen NEG    Sample Expiration 07/29/2014     Assessment and Plan [redacted]w[redacted]d  IUGR and Oligo There was a decel noted that recovered.  Will do continuous monitoring.   NST now reassuring.

## 2014-07-28 ENCOUNTER — Inpatient Hospital Stay (HOSPITAL_COMMUNITY)
Admit: 2014-07-28 | Discharge: 2014-07-28 | Disposition: A | Discharge status: Home / Self Care | Payer: Medicaid Other | Attending: Obstetrics and Gynecology | Admitting: Obstetrics and Gynecology

## 2014-07-28 NOTE — Plan of Care (Signed)
Problem: Phase I Progression Outcomes Goal: LOS < 4 days Outcome: Not Met (add Reason) LOS has been over 4 days

## 2014-07-28 NOTE — Progress Notes (Addendum)
Hospital day # 20 pregnancy at [redacted]w[redacted]d--Severe IUGR and oligohydramnios  S:  Doing well overall, no complaints      Perception of contractions: none      Vaginal bleeding: None       Vaginal discharge:  None  O: BP 109/72 mmHg  Pulse 95  Temp(Src) 98.5 F (36.9 C) (Oral)  Resp 20  Ht 5\' 2"  (1.575 m)  Wt 84.732 kg (186 lb 12.8 oz)  BMI 34.16 kg/m2  SpO2 97%  LMP 12/22/2013      Fetal tracings:  Overall Category 1--2 prolonged decels since 7pm (one at 2208 and 1 at 0912--both slow descent to around 90-100 over a 5-7 min period, then slow return to baseline).  Moderate variability and accelerations present prior to and subsequent to occurrence of decels.      Contractions:   None      Uterus gravid and non-tender      Extremities: no significant edema and no signs of DVT          Labs:  T&S current as of 07/26/14       Meds:  . docusate sodium  100 mg Oral Daily  . ferrous sulfate  325 mg Oral BID WC  . pantoprazole  20 mg Oral Daily  . prenatal multivitamin  1 tablet Oral Q1200  . sodium chloride  3 mL Intravenous Q12H    [place] today:  BPP 8/8, oligohydramnios, with AFI 5.57, < 3%ile, 3.79 cm pocket.  Vtx.  Dopplers normal, no absent/reverse flow.  A: [redacted]w[redacted]d with severe IUGR and oligohydramnios      Overall reassuring FHR status     Stable  P: Continue current plan of care      Upcoming tests/treatments:  Dopplers 2x/week, AFI next week per previous notes      Continuous EFM at present.      MDs will follow  Donnel Saxon CNM, [place] 07/28/2014 1:50 PM  Addendum: In to see patient now that husband is here to review any questions. Discussed hope to achieve at least 34 weeks, with understanding that timing of delivery will depend on fetal status. Support to patient and husband for challenges of long-term hospitalization.  Patient notes sporadic vertigo-type sx, with dizziness and nausea. Zofran and Antivert are helpful.  Continue current care.  Donnel Saxon, CNM 07/28/14  6:45p  Agree with above

## 2014-07-28 NOTE — Clinical SW OB High Risk (Signed)
Clinical Social Work Antenatal   Clinical Social Worker:  Alphonzo Cruise, Skillman Date/Time:  07/28/2014, 9:23 AM Gestational Age on Admission:  34 y.o. Admitting Diagnosis:  IUGR, Oligohydramnios   Expected Delivery Date:  09/28/14  Family/Home Environment  Home Address:  [place], [place], [place] [address]  Household Member/Support Name:   Relationship:   Spouse Other Support:  Patient reports they have family in [place] and [place].  She states her mother lives in [place] and has an appointment to apply for a Visa today to come be with them.  She states if she is granted a Visa, they will be purchasing her a plane ticket immediately.     Psychosocial Data  Information Source:  Patient Interview Resources:  Limited   Employment:  FOB works at a [employer]):  [place]:  N/A   Current Grade:  N/A  Homebound Arranged:    Other Resources:  Medicaid  Cultural/Environment Issues Impacting Care:  Patient's first language is Arabic.  She speaks some [place].  She declined the need for an interpreter, however, her 64 year old son had to help her communicate at times.   Strengths/Weaknesses/Factors to Consider  Concerns Related to Hospitalization:  There has been a concern brought to CSW's attention by nursing staff that FOB is dropping off the 35 year old and 3 year old sons with the patient after school and leaving to go back to work.  This is a safety concern as there will be no one to be with them in the event patient is rushed to the OR in an emergency.  There have been no concerns stated by patient, other than inability to make other arrangements for her children.    Previous Pregnancies/Feelings Towards Pregnancy?  Concerns related to being/becoming a mother?:  Patient appears happy about pregnancy and states this is her first girl.    Social Support (FOB? Who is/will be helping with baby/other kids?):  FOB is involved and  supportive, but works during the day.  Couples Relationship (describe): Patient describes the relationship as supportive and positive.   Recent Stressful Life Events (life changes in past year?):  Not discussed at this time.   Prenatal Care/Education/Home Preparations: Not discussed at this time.   Domestic Violence (of any type):  No If Yes to Domestic Violence, Describe/Action Plan:     Substance Use During Pregnancy: No (If Yes, Complete SBIRT)  Complete PHQ-9 (Depresssion Screening) on all Antenatal Patients PHQ-9 Score (If Score => 15 complete TREAT):     Follow-up Recommendations:  Patient wished for CSW to speak to her husband.  CSW received call from patient's husband who initially stated that there were no other options for his children after school since his wife is typically home and the boys stay with her in the afternoons.  He states they have a community of support, but that everyone they know also works and is unavailable to assist.  CSW stated understanding and acknowledged the stress of the situation.  However, CSW insisted that per nursing policy, the children cannot be here without another adult for everyone's safety and that he will not be allowed to leave them again.  CSW suggested that he contact his son's school and see if [employer] after school program would be an option while they await patient's mother's visit.  The other son is in daycare and CSW asked if it would be possible for him to stay there a couple more hours  in the afternoon.         Patient Advised/Response:   FOB stated understanding and reports that he will not leave the children with patient unattended again.   Other:      Clinical Assessment/Plan:   CSW assured patient and FOB that their children were not causing problems or being disruptive and that the staff were not trying to make a difficult situation more stressful for them.  CSW explained the safety risk this situation poses.  Patient and  husband were understanding, although they seem understandably frustrated by the circumstances.  CSW explained support services offered by CSW and asked them to call if they feel they need to talk at any time.  Both patient and FOB thanked CSW.

## 2014-07-29 LAB — TYPE AND SCREEN
ABO/RH(D): A POS
Antibody Screen: NEGATIVE

## 2014-07-29 MED ORDER — ONDANSETRON 4 MG PO TBDP: 4.0000 mg | ORAL_TABLET | Freq: Three times a day (TID) | ORAL | Status: DC | PRN

## 2014-07-29 MED ORDER — MECLIZINE HCL 25 MG PO TABS: 25.0000 mg | ORAL_TABLET | Freq: Three times a day (TID) | ORAL | Status: DC | PRN

## 2014-07-29 NOTE — Discharge Instructions (Signed)
Intrauterine Growth Restriction Intrauterine growth restriction (IUGR) means that the baby is smaller than normal at the time of the pregnancy or at birth. This should not be confused with Small for Gestational Age (SGA), which means the baby's weight at birth is at the lower end (less than 10%) of normal birth weights.  CAUSES  Medical problems with the mother:  High blood pressure.  Kidney, lung or heart disease.  Diabetes with arteriosclerosis.  Hemoglobinopathies- blood diseases.  Antiphospholipid antibody syndrome - a disorder of the immune system. Other causes: 1. Smoking, drug abuse and excessive alcohol drinking. 2. Diseases of the placenta. 3. Having twins or more. 4. Malnutrition. 5. Infections. 6. Genetic problems. 75. Pregnant women 35 years old or younger and pregnant women 35 years old or older. 8. Exposure to toxic chemicals. SYMPTOMS  Smaller than normal uterus when measuring the uterine size on the abdomen.  Ultrasound measurements of the fetuses head circumference, the abdominal circumference, the diameter of the biparietal area (sides) of the head and the length of the femur less than normal indicating IUGR. RISKS AND COMPLICATIONS  Fetal death in the uterus or a stillborn baby.  Not having enough fluid in the baby's sac (oligohydramnios).  Fetal heart rate problems. This leads to more Cesarean Section deliveries.  Low Apgar scores (evaluates the baby's condition at birth).  Increase in the acidity of the baby's blood (acidosis). SGA babies can also have complications such as:  Low blood sugar.  Increase of bilirubin in the blood.  Low body temperature (hypothermia)  Low Apgar scores, convulsions, fetal death and stillborn. TREATMENT   Close following and monitoring of the fetus during the pregnancy.  Treat infections that may be present.  Treat and control the medical disease present.  Look at the condition of the fetus with non-tress tests,  contraction stress tests and biophysical profile of the fetus.  Doppler ultrasound (measure the umbilical artery blood flow) lowers the risk of fetal death and stillborn by delivering the baby early if it is abnormal. HOME CARE INSTRUCTIONS   Follow your caregiver's advice, instructions and keep all of your prenatal appointments.  Get plenty of rest and sleep.  Eat a balanced diet and take all your vitamin and mineral supplements.  Do not over use your energy with hard exercise, work and household activities.  Do not exercise unless your caregiver says it is OK to do so.  Do not smoke, drink alcohol or take illegal drugs.  Avoid chemicals like pesticides. SEEK MEDICAL CARE IF:   You develop a temperature of 100 F (37.8 C) or higher.  You do not feel the baby moving as much or not at all.  You develop leaking of fluid from the vagina.  You develop vaginal bleeding.  You develop abdominal pain.  You develop uterine contractions. Document Released: 11/30/2007 Document Revised: 07/07/2013 Document Reviewed: 11/30/2007 [facility] Patient Information 2015 [place], [place]. This information is not intended to replace advice given to you by your health care provider. Make sure you discuss any questions you have with your health care provider.  Oligohydramnios An unborn baby (fetus) lives in the mother's uterus in a sac of amniotic fluid. This fluid: Protects the fetus from trauma. Helps the fetus move freely inside the uterus. Helps the fetal lungs, kidneys, and digestive system develop. Protects the baby from infections. Oligohydramnios is when there is not enough amniotic fluid in the amniotic sac. If this happens early in pregnancy, a fetus might not develop normally. If this  happens in the second half of a pregnancy, a fetus might not grow as much as it should and could cause problems during delivery.  Oligohydramnios can also cause: Pregnancy loss (miscarriage). Premature  birth. Deformities of the face or body. Problems with muscles and bones. Pressure and compression on the umbilical cord, which decreases oxygen to the fetus. Lung problems. Stillbirth. CAUSES A cause cannot always be found.However, possible causes include: A leak or a tear in the amniotic sac. A problem with the placenta. Having identical twins who share the same placenta. A fetal birth defect. This is usually something in the fetal kidneys or urinary tract that has not developed as it should. A pregnancy that goes past the due date. Something that affects the mother's body, such as: Dehydration. High blood pressure. Diabetes. Some medications (examples include ibuprofen and blood pressure medicines). A disease that affects the skin, joints, kidneys, and other organs (systemic lupus). Birth defects. SYMPTOMS There may be no symptoms. Leaking fluid from the vagina. Measuring smaller than usual uterus at a routine pregnancy exam. DIAGNOSIS To diagnose and evaluate oligohydramnios, your caregiver may: Order a prenatal ultrasound test. This test: Measures the amniotic fluid level. This will show if the amount of fluid is right for the stage of pregnancy. Checks the fetal kidneys. Checks fetal growth. Evaluates the placenta. Confirm that you broke your water, if this is suspected by your caregiver. Order a nonstress test. This noninvasive test is an assessment of fetal well-being.It monitors the fetal heart rate patterns over a 20-minute period. Order a biophysical profile. This test measures and evaluates 5 observations of the baby (results of nonstress testing, fetal breathing, movements, muscle tone, and amount of amniotic fluid). Assess fetal kick counts. Tell your caregiver if the baby appears to become less active. Order a uterine artery Doppler study. This is a type of ultrasound. It can show if enough blood and nourishment are getting to the fetus through the placenta and  umbilical cord. Check your blood pressure. Check your blood sugar. TREATMENT Treatment will depend on how low the fluid is, how far along in the pregnancy you are, and your overall health. Treatment options include: Watching and waiting. You will need to be checked more often than normal. Increasing your fluid intake. This may be done by mouth, or you might get the fluids through the vein (intravenously, IV). Delivering the baby if recommended by your caregiver. HOME CARE INSTRUCTIONS Only take medicine as directed by your caregiver, especially if you have a medical problem (diabetes, high blood pressure). Follow your caregiver's instructions regarding physical activity, especially if you have a medical problem (diabetes, high blood pressure). Keep all prenatal care appointments. Rest as much as possible. Your caregiver may put you on bed rest. Drink enough fluids to keep your urine clear or pale yellow. Eat a healthy and nourishing diet. Do not smoke, drink alcohol, or take illegal drugs. SEEK MEDICAL CARE IF: You have any questions or worries about your pregnancy. You notice a decrease in fetal movement. SEEK IMMEDIATE MEDICAL CARE IF:  Fluid comes out of your vagina. You start to have labor pains (contractions). You have a fever. Document Released: 06/07/2010 Document Revised: 07/07/2013 Document Reviewed: 06/07/2010 [facility] Patient Information 2015 [place], [place]. This information is not intended to replace advice given to you by your health care provider. Make sure you discuss any questions you have with your health care provider.  Fetal Movement Counts Performing a fetal movement count is highly recommended in high-risk pregnancies,  but it is good for every pregnant woman to do. Your health care provider may ask you to start counting fetal movements at 28 weeks of the pregnancy. Fetal movements often increase:  After eating a full meal.  After physical activity.  After  eating or drinking something sweet or cold.  At rest. Pay attention to when you feel the baby is most active. This will help you notice a pattern of your baby's sleep and wake cycles and what factors contribute to an increase in fetal movement. It is important to perform a fetal movement count at the same time each day when your baby is normally most active.  HOW TO COUNT FETAL MOVEMENTS 9. Find a quiet and comfortable area to sit or lie down on your left side. Lying on your left side provides the best blood and oxygen circulation to your baby. 10. Write down the day and time on a sheet of paper or in a journal. 11. Start counting kicks, flutters, swishes, rolls, or jabs in a 2-hour period. You should feel at least 10 movements within 2 hours. 12. If you do not feel 10 movements in 2 hours, wait 2-3 hours and count again. Look for a change in the pattern or not enough counts in 2 hours. SEEK MEDICAL CARE IF:  You feel less than 10 counts in 2 hours, tried twice.  There is no movement in over an hour.  The pattern is changing or taking longer each day to reach 10 counts in 2 hours.  You feel the baby is not moving as he or she usually does.

## 2014-07-29 NOTE — Plan of Care (Signed)
Problem: Phase I Progression Outcomes Goal: Maintains reassuring Fetal Heart Rate Outcome: Progressing Progressing toward outcvome. Fhr remains reassuring at this time.

## 2014-07-29 NOTE — Discharge Summary (Signed)
Physician Discharge Summary  Patient ID: Caroline Yang MRN: [id number] DOB/AGE: 03-27-1980 34 y.o.  Admit date: 07/08/2014 Discharge date: 07/30/2014  Admission Diagnoses:  IUP at 35 2/7 weeks, severe IUGR, oligohydramnios, abnormal doppler studies  Discharge Diagnoses:  IUP at 31 2/7 weeks Active Problems:   IUGR (intrauterine growth restriction)   Oligohydramnios   Positive GBS test Normal dopplers  Discharged Condition: stable  Hospital Course: Admitted 07/08/14 for severe IUGR, oligohydramnios, and abnormal dopplers--AFI 0.949, EFW 1+13, 2%ile, BPP 6/8.  Per MFM recommendation, she was followed with continuous EFM, biweekly BPP and dopplers for the next 3 weeks.   Betamethasone course was administered on 5/4 and 5/5.  No evidence of SROM was noted, with negative Amnisure.  ANA and lupus testing was negative. Growth [place] on 5/19--EFW < 10%ile, AFI 3.38 Dopplers normalized during patient's hospital stay. Last BPP/AFI on 07/28/14--AFI 5.57, BPP 8/8, normal dopplers, vtx. Patient was on continuous EFM during her stay, with sporadic prolonged decels (no more than 2x/24 hour period), with reactive FHR prior to and subsequent to decels. Patient on Antivert and Zofran for periodic dizziness/nausea. Dr. Burnett Harry, MFM, was consulted by Dr. Alesia Richards on 5/25--due to stability of fluid volume, normal dopplers, and overall reassuring FHR tracing, patient was OK'd for outpatient management.  Recommendation made for weekly NST and weekly BPP/AFI, with next growth [place] on 08/06/14. Home with [facility] instructions, and discussion of the importance of adequate nutrition and hydration.  She will continue to have increased rest.  Patient and husband agreeable with plan.   Consults: MFM  Significant Diagnostic Studies:  Recent Results (from the past 2160 hour(s))  US OB Follow Up     Status: None   Collection Time: 07/08/14 12:11 PM  Result Value Ref Range   Biparietal Diameter  cm   Abdominal Circumference  cm   Femoral Diameter  cm   Head Circumference  cm   HC/AC     Estimated Fetal Weight  grams  Group B strep by PCR     Status: Abnormal   Collection Time: 07/08/14  5:25 PM  Result Value Ref Range   Group B strep by PCR POSITIVE (A) NEGATIVE    Comment: RESULT CALLED TO, READ BACK BY AND VERIFIED WITH: WARREN,D AT 1823 ON 07/08/14 BY Redbird Smith   Amnisure rupture of membrane (rom)     Status: None   Collection Time: 07/08/14  5:44 PM  Result Value Ref Range   Amnisure ROM NEGATIVE   Lupus anticoagulant panel     Status: None   Collection Time: 07/08/14  6:20 PM  Result Value Ref Range   PTT Lupus Anticoagulant 30.6 0.0 - 50.0 sec   DRVVT 38.7 0.0 - 55.1 sec   Lupus Anticoag Interp Comment:     Comment: (NOTE) No lupus anticoagulant was detected. Performed At: [facility] [place], [place] [id number] Lindon Romp MD NW:[id number]   Type and screen     Status: None   Collection Time: 07/08/14  6:20 PM  Result Value Ref Range   ABO/RH(D) A POS    Antibody Screen NEG    Sample Expiration 07/11/2014   Comprehensive metabolic panel     Status: Abnormal   Collection Time: 07/08/14  6:20 PM  Result Value Ref Range   Sodium 136 135 - 145 mmol/L   Potassium 3.7 3.5 - 5.1 mmol/L   Chloride 106 101 - 111 mmol/L   CO2 22 22 - 32 mmol/L   Glucose, Bld 86  70 - 99 mg/dL   BUN 5 (L) 6 - 20 mg/dL   Creatinine, Ser 0.38 (L) 0.44 - 1.00 mg/dL   Calcium 9.3 8.9 - 10.3 mg/dL   Total Protein 6.8 6.5 - 8.1 g/dL   Albumin 3.1 (L) 3.5 - 5.0 g/dL   AST 14 (L) 15 - 41 U/L   ALT 9 (L) 14 - 54 U/L   Alkaline Phosphatase 85 38 - 126 U/L   Total Bilirubin 0.3 0.3 - 1.2 mg/dL   GFR calc non Af Amer >60 >60 mL/min   GFR calc Af Amer >60 >60 mL/min    Comment: (NOTE) The eGFR has been calculated using the CKD EPI equation. This calculation has not been validated in all clinical situations. eGFR's persistently <90 mL/min signify possible Chronic Kidney Disease.    Anion gap 8  5 - 15  CBC with Differential/Platelet     Status: Abnormal   Collection Time: 07/08/14  6:20 PM  Result Value Ref Range   WBC 7.5 4.0 - 10.5 K/uL   RBC 3.75 (L) 3.87 - 5.11 MIL/uL   Hemoglobin 9.7 (L) 12.0 - 15.0 g/dL   HCT 29.4 (L) 36.0 - 46.0 %   MCV 78.4 78.0 - 100.0 fL   MCH 25.9 (L) 26.0 - 34.0 pg   MCHC 33.0 30.0 - 36.0 g/dL   RDW 13.7 11.5 - 15.5 %   Platelets 336 150 - 400 K/uL   Neutrophils Relative % 67 43 - 77 %   Neutro Abs 5.0 1.7 - 7.7 K/uL   Lymphocytes Relative 26 12 - 46 %   Lymphs Abs 1.9 0.7 - 4.0 K/uL   Monocytes Relative 6 3 - 12 %   Monocytes Absolute 0.5 0.1 - 1.0 K/uL   Eosinophils Relative 1 0 - 5 %   Eosinophils Absolute 0.1 0.0 - 0.7 K/uL   Basophils Relative 0 0 - 1 %   Basophils Absolute 0.0 0.0 - 0.1 K/uL  Uric acid     Status: None   Collection Time: 07/08/14  6:20 PM  Result Value Ref Range   Uric Acid, Serum 3.4 2.3 - 6.6 mg/dL  Lactate dehydrogenase     Status: None   Collection Time: 07/08/14  6:20 PM  Result Value Ref Range   LDH 134 98 - 192 U/L  Cardiolipin antibodies, IgG, IgM, IgA     Status: None   Collection Time: 07/08/14  6:20 PM  Result Value Ref Range   Anticardiolipin IgG <9 0 - 14 GPL U/mL    Comment: (NOTE)                          Negative:              <15                          Indeterminate:     15 - 20                          Low-Med Positive: >20 - 80                          High Positive:         >80    Anticardiolipin IgM <9 0 - 12 MPL U/mL    Comment: (NOTE)  Negative:              <13                          Indeterminate:     13 - 20                          Low-Med Positive: >20 - 80                          High Positive:         >80    Anticardiolipin IgA <9 0 - 11 APL U/mL    Comment: (NOTE)                          Negative:              <12                          Indeterminate:     12 - 20                          Low-Med Positive: >20 - 80                           High Positive:         >80 Performed At: [facility] [place], [place] [id number] Lindon Romp MD GE:[id number]   ABO/Rh     Status: None   Collection Time: 07/08/14  6:20 PM  Result Value Ref Range   ABO/RH(D) A POS   OB RESULT CONSOLE Group B Strep     Status: None   Collection Time: 07/08/14  6:41 PM  Result Value Ref Range   GBS Positive   Protein / creatinine ratio, urine     Status: Abnormal   Collection Time: 07/08/14  8:58 PM  Result Value Ref Range   Creatinine, Urine 55.00 mg/dL   Total Protein, Urine 9 mg/dL    Comment: NO NORMAL RANGE ESTABLISHED FOR THIS TEST   Protein Creatinine Ratio 0.16 (H) 0.00 - 0.15 mg/mg[Cre]  Type and screen     Status: None   Collection Time: 07/11/14  4:49 PM  Result Value Ref Range   ABO/RH(D) A POS    Antibody Screen NEG    Sample Expiration 07/14/2014   Type and screen     Status: None   Collection Time: 07/14/14  4:55 PM  Result Value Ref Range   ABO/RH(D) A POS    Antibody Screen NEG    Sample Expiration 07/17/2014   Type and screen     Status: None   Collection Time: 07/17/14  3:50 PM  Result Value Ref Range   ABO/RH(D) A POS    Antibody Screen NEG    Sample Expiration 07/20/2014   Type and screen     Status: None   Collection Time: 07/20/14  5:28 PM  Result Value Ref Range   ABO/RH(D) A POS    Antibody Screen NEG    Sample Expiration 07/23/2014   Type and screen     Status: None   Collection Time: 07/23/14  5:02 PM  Result Value Ref Range   ABO/RH(D) A POS  Antibody Screen NEG    Sample Expiration 07/26/2014   Type and screen     Status: None   Collection Time: 07/26/14  5:13 PM  Result Value Ref Range   ABO/RH(D) A POS    Antibody Screen NEG    Sample Expiration 07/29/2014   Type and screen     Status: None   Collection Time: 07/29/14  5:17 PM  Result Value Ref Range   ABO/RH(D) A POS    Antibody Screen NEG    Sample Expiration 08/01/2014    and radiology: Ultrasound: Biweekly  BPP/AFI/dopplers  Treatments: IV hydration. Betamethasone course  Discharge Exam: Blood pressure 102/50, pulse 89, temperature 98.2 F (36.8 C), temperature source Oral, resp. rate 18, height 5' 2"  (1.575 m), weight 84.324 kg (185 lb 14.4 oz), last menstrual period 12/22/2013, SpO2 97 %, unknown if currently breastfeeding. General appearance: alert Resp: clear to auscultation bilaterally Cardio: regular rate and rhythm, S1, S2 normal, no murmur, click, rub or gallop Extremities: extremities normal, atraumatic, no cyanosis or edema FHR reactive, no UCs  Disposition: 01-Home or Self Care     Medication List    TAKE these medications        acetaminophen 325 MG tablet  Commonly known as:  TYLENOL  Take 325 mg by mouth every 6 (six) hours as needed for mild pain or headache.     meclizine 25 MG tablet  Commonly known as:  ANTIVERT  Take 1 tablet (25 mg total) by mouth 3 (three) times daily as needed for dizziness.     ondansetron 4 MG disintegrating tablet  Commonly known as:  ZOFRAN-ODT  Take 1 tablet (4 mg total) by mouth every 8 (eight) hours as needed for nausea or vomiting.     prenatal multivitamin Tabs tablet  Take 1 tablet by mouth daily at 12 noon.       Follow-up Information    Follow up with [facility] On 07/31/2014.   Specialty:  Obstetrics and Gynecology   Why:  Office will call you to schedule visit and ultrasound on Friday, 07/31/14.  Call for any questions or concerns.  Call with any decrease in baby movement.   Contact information:   [address] [place] [address] [phone]      Signed: Donnel Saxon 07/30/2014, 9:10 AM

## 2014-07-29 NOTE — Progress Notes (Addendum)
Hospital day # 21pregnancy at 31w2--Severe IUGR and oligohydramnios  Subjective: Patient reports no complaints.    Objective: I have reviewed patient's vital signs. Filed Vitals:   07/29/14 0004 07/29/14 1100 07/29/14 1211 07/29/14 1225  BP: 103/52 102/67  97/65  Pulse: 90 97  98  Temp: 98.1 F (36.7 C) 98.1 F (36.7 C)  97.7 F (36.5 C)  TempSrc: Oral Oral    Resp: 20 16    Height:      Weight:   84.324 kg (185 lb 14.4 oz)   SpO2:       General: alert, cooperative and no distress GI: normal findings: Gravid Extremities: no edema, redness or tenderness in the calves or thighs NST 5/25 @ 1300: BL 130, mod variability, reactive. No decels noted since 6am. TOCO: No contractions.   Assessment/Plan: Hospital day # 21pregnancy at 31w2--Severe IUGR and oligohydramnios, last fluid 5/24 was AFI of 5 cm, normal Dopplers Continue with current care Next ultrasound due Friday 5/27 with MFM: If normal fluid and reassuring fetal status may plan for outpatient management as per MFM.     LOS: 21 days    [facility] 07/29/2014, 5:04 PM  I discussed patient with MFM, Dr. Burnett Harry: Patient can be managed as an outpatient with twice weekly testing (NST and BPP Q week) and follow up growth [place] 2 weeks from the last one done on 5/19.  I discussed these recommendations with patient and her husband (who provided translation to patient) who are agreeable.  Strict fetal kick counts discussed.  Discussed importance of adequate nutrition and hydration for patient.  Dr. Alesia Richards.

## 2014-07-31 ENCOUNTER — Other Ambulatory Visit (HOSPITAL_COMMUNITY): Payer: Self-pay | Admitting: Maternal and Fetal Medicine

## 2014-07-31 ENCOUNTER — Ambulatory Visit (HOSPITAL_COMMUNITY)
Admission: AD | Admit: 2014-07-31 | Discharge: 2014-07-31 | Disposition: A | Discharge status: Home / Self Care | Payer: Medicaid Other | Source: Ambulatory Visit | Attending: Obstetrics and Gynecology | Admitting: Obstetrics and Gynecology

## 2014-07-31 DIAGNOSIS — O4103X0 Oligohydramnios, third trimester, not applicable or unspecified: Secondary | ICD-10-CM

## 2014-07-31 DIAGNOSIS — O2613 Low weight gain in pregnancy, third trimester: Secondary | ICD-10-CM

## 2014-07-31 DIAGNOSIS — O365930 Maternal care for other known or suspected poor fetal growth, third trimester, not applicable or unspecified: Secondary | ICD-10-CM

## 2014-07-31 DIAGNOSIS — O09293 Supervision of pregnancy with other poor reproductive or obstetric history, third trimester: Secondary | ICD-10-CM

## 2014-07-31 NOTE — ED Notes (Signed)
Interpretor provided by language resources here today.

## 2014-08-04 ENCOUNTER — Ambulatory Visit (HOSPITAL_COMMUNITY): Payer: Medicaid Other

## 2014-08-04 ENCOUNTER — Inpatient Hospital Stay (HOSPITAL_COMMUNITY): Payer: Medicaid Other

## 2014-08-07 ENCOUNTER — Ambulatory Visit (HOSPITAL_COMMUNITY): Payer: Medicaid Other

## 2014-08-11 ENCOUNTER — Ambulatory Visit (HOSPITAL_COMMUNITY): Payer: Medicaid Other

## 2014-08-14 ENCOUNTER — Ambulatory Visit (HOSPITAL_COMMUNITY): Payer: Medicaid Other

## 2014-08-18 ENCOUNTER — Ambulatory Visit (HOSPITAL_COMMUNITY): Payer: Medicaid Other

## 2014-08-21 ENCOUNTER — Ambulatory Visit (HOSPITAL_COMMUNITY): Payer: Medicaid Other

## 2014-08-25 ENCOUNTER — Ambulatory Visit (HOSPITAL_COMMUNITY): Payer: Medicaid Other

## 2014-08-28 ENCOUNTER — Ambulatory Visit (HOSPITAL_COMMUNITY): Payer: Medicaid Other

## 2014-09-01 ENCOUNTER — Ambulatory Visit (HOSPITAL_COMMUNITY): Payer: Medicaid Other

## 2014-09-03 ENCOUNTER — Encounter (HOSPITAL_COMMUNITY): Payer: Self-pay | Admitting: *Deleted

## 2014-09-03 ENCOUNTER — Telehealth (HOSPITAL_COMMUNITY): Payer: Self-pay | Admitting: *Deleted

## 2014-09-03 NOTE — Telephone Encounter (Signed)
Preadmission screen  

## 2014-09-04 ENCOUNTER — Ambulatory Visit (HOSPITAL_COMMUNITY): Payer: Medicaid Other

## 2014-09-07 NOTE — MAU Provider Note (Signed)
Caroline Yang is a 34 y.o. female, [id number] at 30 weeks, presenting for IOL.  Patient with diagnosis of IUGR, Oligo, and HTN.  Patient s/p BMZ and GBS positive. Patient does desire epidural for pain mgmt.   Patient Active Problem List   Diagnosis Date Noted  . IUGR (intrauterine growth restriction) 07/08/2014  . Oligohydramnios 07/08/2014  . Positive GBS test 07/08/2014    History of present pregnancy: Patient entered care at 8.1 weeks.   EDC of 09/28/2014 was established by Definite LMP of 12/22/2013.   Anatomy scan:  20.2 weeks, with normal findings and an anterior placenta.   Additional [place] evaluations:   -28.2wks: Growth U/S reviewed: EFW 827g (2%), LAGGING AC, AFI 0.9 (OLIGO), Dopplers S/D=3.09 (normal for GA), BPP 6/8. Fetal decal noted on U/S with return to normal.    -31.4wks: BPP 8/8, AFI 8.8 6% and improved since last study, nl dopplers, cephalic -14.7WGN: Singleton, vertex, anterior placenta, AFI=35%. BPP 8/8, Non reactive NST Umbilical artery dopplers=no absent or reversed flow S/D ratio=2.09 5th%=2.17 for 32 weeks 50%=2.78 for 32 weeks All growth parameter are <3rd% -32.4wks: BPP 8/8, nl fluid with AFI 10, vtx, nl dopplers -33.1wks: BPP U/S: AFI: 9.92 cm low normal cx 4.07 cm cx closed BPP 8/8. Umbilical artery Dopplers; S/D ratio= 3.35. No absent or reversed flow seen. 5OTH%= 2/70 for [redacted] weeks gestation 95th%= 3.70 cm -33.4wks: [place] RESULTS EFW 2LB 13 OZ (<3%), AFI 10.3, BPP 8/8 -34.1wks: U/S - breech, BPP 8/8, nl dopplers at 2.88 and 50% 2.63. cont biweekly BPP/AFI and dopplers  -34.4wks: U/S: vertex AFI: 9.03 cm 15%tile BPP 8/8 normal dopplers  -35.1wks: [place] TODAY--EFW 3+7, <3%ILE, WITH ALL BIOMETRICS <3%ILE. AFI 9.86, 15%ILE, BPP 8/8, DOPPLERS <95%ILE, WITH NO ABSENT/REVERSE END DIASTOLIC FLOW.  -56.2ZHY: BPP 8/8 AFI 10 WITH NORMAL DOPPLERS.  -36.4wks: BPP 8/8, nl fluid, AFI 10, vtx, nl dopplers Significant prenatal events:  First Trimester: Pt states she has a running nose,  congestion, & sore throat. Patient also reports constipation.  2nd Trimester: Patient with epigastric pain x 4 days with loss of appetite. Pt states the pain is worse after eating. Patient reports decreased appetite and constipation. 3rd Trimester: Admitted to [facility] 07/08/14-07/29/14 for severe IUGR and oligohydramnios; ok'd for outpt mgmt. Patient with elevated BP and dizziness, but declined MAU evaluation and PIH labs negative.   Last evaluation: 09/04/2014   36 wks 4 days   Dr. Octavio Manns BP: 120/70 Wt: 193 lbs  OB History    Gravida Para Term Preterm AB TAB SAB Ectopic Multiple Living   4 2 2  1  1   2      Past Medical History  Diagnosis Date  . Kidney stones 2011    after birth of 2nd child  . History of PCOS    Past Surgical History  Procedure Laterality Date  . Wisdom tooth extraction    . Dilation and evacuation N/A 11/28/2012    Procedure: DILATATION AND EVACUATION, WITH INTRA-OPERATIVE ULTRSOUND;  Surgeon: Lavonia Drafts, MD;  Location: [facility];  Service: Gynecology;  Laterality: N/A;   Family History: family history includes Thyroid disease in her son. Social History:  reports that she has never smoked. She does not have any smokeless tobacco history on file. She reports that she does not drink alcohol or use illicit drugs. Patient is from [place] and of the Muslim faith.  She is married to husband, Philemon Kingdom, who acts as Optometrist for patient who speaks little [place].  Patient is a Charity fundraiser.  Prenatal Transfer Tool  Maternal Diabetes: No Genetic Screening: Normal Maternal Ultrasounds/Referrals: Abnormal:  Findings:   IUGR Fetal Ultrasounds or other Referrals:  Referred to Materal Fetal Medicine  Maternal Substance Abuse:  No Significant Maternal Medications:  None Significant Maternal Lab Results: Lab values include: Group B Strep positive    ROS:  -Ctx, -LoF, -Vb, +FM, - Recent illness  No Known Allergies     Last menstrual period 12/22/2013, unknown  if currently breastfeeding.  Physical Exam  Constitutional: She is oriented to person, place, and time. She appears well-developed and well-nourished.  HENT:  Head: Normocephalic and atraumatic.  Eyes: EOM are normal. Pupils are equal, round, and reactive to light.  Neck: Normal range of motion.  Cardiovascular: Normal rate and regular rhythm.   Respiratory: Effort normal and breath sounds normal.  GI: Soft. Bowel sounds are normal.  Musculoskeletal: Normal range of motion. She exhibits edema (Nonpitting).  Neurological: She is alert and oriented to person, place, and time.  Skin: Skin is warm and dry.   FHR: 145 bpm, Mod Var, -Decels, +10x10Accels UCs:  None graphed or palpated  Prenatal labs: ABO, Rh: --/--/A POS (05/25 1717) Antibody: NEG (05/25 1717) Rubella:    Immune RPR: Nonreactive (11/04 0000)  HBsAg: Negative (11/04 0000)  HIV: Non-reactive (11/04 0000)  GBS: Positive (05/04 1841) Sickle cell/Hgb electrophoresis:  Normal Pap:  Normal 02/2014 GC:  Negative Chlamydia:  Negative Other:  PIH Labs: 08/25/14 Normal   Assessment IUP at 37wks Cat I FT IUGR Oligo GBS Positive  Plan: Admit to [facility] per consult with Dr. Octavio Manns Routine Labor and Delivery Orders per [facility] Protocol In room to complete assessment and discuss POC: -Cytotec Induction -When to get epidural -When PCN will start Questions and concerns addressed Will return at 0200 to start induction process  Sundra Haddix LYNNCNM, MSN 09/07/2014, 11:52 PM

## 2014-09-08 ENCOUNTER — Inpatient Hospital Stay (HOSPITAL_COMMUNITY): Payer: Medicaid Other | Admitting: Anesthesiology

## 2014-09-08 ENCOUNTER — Encounter (HOSPITAL_COMMUNITY)
Admission: RE | Disposition: A | Discharge status: Home / Self Care | Payer: Self-pay | Source: Ambulatory Visit | Attending: Obstetrics and Gynecology

## 2014-09-08 ENCOUNTER — Inpatient Hospital Stay (HOSPITAL_COMMUNITY)
Admission: RE | Admit: 2014-09-08 | Discharge: 2014-09-11 | DRG: 765 | Disposition: A | Discharge status: Home / Self Care | Payer: Medicaid Other | Source: Ambulatory Visit | Attending: Obstetrics and Gynecology | Admitting: Obstetrics and Gynecology

## 2014-09-08 ENCOUNTER — Encounter (HOSPITAL_COMMUNITY): Payer: Self-pay

## 2014-09-08 DIAGNOSIS — O365930 Maternal care for other known or suspected poor fetal growth, third trimester, not applicable or unspecified: Principal | ICD-10-CM | POA: Diagnosis present

## 2014-09-08 DIAGNOSIS — O99824 Streptococcus B carrier state complicating childbirth: Secondary | ICD-10-CM | POA: Diagnosis present

## 2014-09-08 DIAGNOSIS — Z98891 History of uterine scar from previous surgery: Secondary | ICD-10-CM

## 2014-09-08 DIAGNOSIS — O9081 Anemia of the puerperium: Secondary | ICD-10-CM | POA: Diagnosis present

## 2014-09-08 DIAGNOSIS — O4103X0 Oligohydramnios, third trimester, not applicable or unspecified: Secondary | ICD-10-CM | POA: Diagnosis present

## 2014-09-08 DIAGNOSIS — Z3A37 37 weeks gestation of pregnancy: Secondary | ICD-10-CM | POA: Diagnosis present

## 2014-09-08 DIAGNOSIS — Z23 Encounter for immunization: Secondary | ICD-10-CM

## 2014-09-08 DIAGNOSIS — O9989 Other specified diseases and conditions complicating pregnancy, childbirth and the puerperium: Secondary | ICD-10-CM | POA: Diagnosis present

## 2014-09-08 DIAGNOSIS — IMO0002 Reserved for concepts with insufficient information to code with codable children: Secondary | ICD-10-CM | POA: Diagnosis present

## 2014-09-08 DIAGNOSIS — N90812 Female genital mutilation Type II status: Secondary | ICD-10-CM | POA: Diagnosis present

## 2014-09-08 DIAGNOSIS — D649 Anemia, unspecified: Secondary | ICD-10-CM | POA: Diagnosis present

## 2014-09-08 DIAGNOSIS — Z87442 Personal history of urinary calculi: Secondary | ICD-10-CM | POA: Diagnosis not present

## 2014-09-08 LAB — CBC
HEMATOCRIT: 34.1 % — AB (ref 36.0–46.0)
HEMOGLOBIN: 11 g/dL — AB (ref 12.0–15.0)
MCH: 26.1 pg (ref 26.0–34.0)
MCHC: 32.3 g/dL (ref 30.0–36.0)
MCV: 80.8 fL (ref 78.0–100.0)
Platelets: 350 10*3/uL (ref 150–400)
RBC: 4.22 MIL/uL (ref 3.87–5.11)
RDW: 17.5 % — AB (ref 11.5–15.5)
WBC: 7.4 10*3/uL (ref 4.0–10.5)

## 2014-09-08 LAB — TYPE AND SCREEN
ABO/RH(D): A POS
Antibody Screen: NEGATIVE

## 2014-09-08 LAB — COMPREHENSIVE METABOLIC PANEL
ALT: 11 U/L — AB (ref 14–54)
AST: 20 U/L (ref 15–41)
Albumin: 3.1 g/dL — ABNORMAL LOW (ref 3.5–5.0)
Alkaline Phosphatase: 125 U/L (ref 38–126)
Anion gap: 6 (ref 5–15)
BUN: 7 mg/dL (ref 6–20)
CO2: 21 mmol/L — AB (ref 22–32)
Calcium: 9.1 mg/dL (ref 8.9–10.3)
Chloride: 107 mmol/L (ref 101–111)
Creatinine, Ser: 0.4 mg/dL — ABNORMAL LOW (ref 0.44–1.00)
Glucose, Bld: 86 mg/dL (ref 65–99)
POTASSIUM: 3.8 mmol/L (ref 3.5–5.1)
SODIUM: 134 mmol/L — AB (ref 135–145)
Total Bilirubin: 0.2 mg/dL — ABNORMAL LOW (ref 0.3–1.2)
Total Protein: 6.9 g/dL (ref 6.5–8.1)

## 2014-09-08 LAB — PROTEIN / CREATININE RATIO, URINE
Creatinine, Urine: 37 mg/dL
Protein Creatinine Ratio: 0.16 mg/mg{Cre} — ABNORMAL HIGH (ref 0.00–0.15)
TOTAL PROTEIN, URINE: 6 mg/dL

## 2014-09-08 LAB — LACTATE DEHYDROGENASE: LDH: 154 U/L (ref 98–192)

## 2014-09-08 LAB — RPR: RPR Ser Ql: NONREACTIVE

## 2014-09-08 LAB — URIC ACID: URIC ACID, SERUM: 3.7 mg/dL (ref 2.3–6.6)

## 2014-09-08 SURGERY — Surgical Case
Anesthesia: Spinal

## 2014-09-08 MED ORDER — FENTANYL CITRATE (PF) 100 MCG/2ML IJ SOLN
INTRAMUSCULAR | Status: DC | PRN
  Administered 2014-09-08: 10 ug via INTRATHECAL

## 2014-09-08 MED ORDER — DIPHENHYDRAMINE HCL 25 MG PO CAPS: 25.0000 mg | ORAL_CAPSULE | Freq: Four times a day (QID) | ORAL | Status: DC | PRN

## 2014-09-08 MED ORDER — SIMETHICONE 80 MG PO CHEW
80.0000 mg | CHEWABLE_TABLET | Freq: Three times a day (TID) | ORAL | Status: DC
Start: 2014-09-08 — End: 2014-09-11
  Administered 2014-09-09 – 2014-09-11 (×6): 80 mg via ORAL
  Filled 2014-09-08 (×6): qty 1

## 2014-09-08 MED ORDER — NALBUPHINE HCL 10 MG/ML IJ SOLN: 5.0000 mg | Freq: Once | INTRAMUSCULAR | Status: AC | PRN

## 2014-09-08 MED ORDER — LANOLIN HYDROUS EX OINT: 1.0000 "application " | TOPICAL_OINTMENT | CUTANEOUS | Status: DC | PRN

## 2014-09-08 MED ORDER — LACTATED RINGERS IV SOLN
INTRAVENOUS | Status: DC | PRN
  Administered 2014-09-08 (×2): via INTRAVENOUS

## 2014-09-08 MED ORDER — ZOLPIDEM TARTRATE 5 MG PO TABS: 5.0000 mg | ORAL_TABLET | Freq: Every evening | ORAL | Status: DC | PRN

## 2014-09-08 MED ORDER — ONDANSETRON HCL 4 MG/2ML IJ SOLN: 4.0000 mg | Freq: Three times a day (TID) | INTRAMUSCULAR | Status: DC | PRN

## 2014-09-08 MED ORDER — CEFAZOLIN SODIUM-DEXTROSE 2-3 GM-% IV SOLR
2.0000 g | Freq: Once | INTRAVENOUS | Status: AC
  Administered 2014-09-08: 2 g via INTRAVENOUS
  Filled 2014-09-08: qty 50

## 2014-09-08 MED ORDER — NALOXONE HCL 0.4 MG/ML IJ SOLN: 0.4000 mg | INTRAMUSCULAR | Status: DC | PRN

## 2014-09-08 MED ORDER — FENTANYL CITRATE (PF) 100 MCG/2ML IJ SOLN
INTRAMUSCULAR | Status: AC
  Filled 2014-09-08: qty 2

## 2014-09-08 MED ORDER — SCOPOLAMINE 1 MG/3DAYS TD PT72
MEDICATED_PATCH | TRANSDERMAL | Status: AC
  Filled 2014-09-08: qty 1

## 2014-09-08 MED ORDER — MEPERIDINE HCL 25 MG/ML IJ SOLN: 6.2500 mg | INTRAMUSCULAR | Status: DC | PRN

## 2014-09-08 MED ORDER — MENTHOL 3 MG MT LOZG: 1.0000 | LOZENGE | OROMUCOSAL | Status: DC | PRN

## 2014-09-08 MED ORDER — OXYCODONE-ACETAMINOPHEN 5-325 MG PO TABS: 1.0000 | ORAL_TABLET | ORAL | Status: DC | PRN

## 2014-09-08 MED ORDER — PHENYLEPHRINE 8 MG IN D5W 100 ML (0.08MG/ML) PREMIX OPTIME
INJECTION | INTRAVENOUS | Status: AC
  Filled 2014-09-08: qty 100

## 2014-09-08 MED ORDER — IBUPROFEN 600 MG PO TABS
600.0000 mg | ORAL_TABLET | Freq: Four times a day (QID) | ORAL | Status: DC
  Administered 2014-09-08 – 2014-09-11 (×11): 600 mg via ORAL
  Filled 2014-09-08 (×11): qty 1

## 2014-09-08 MED ORDER — FENTANYL CITRATE (PF) 100 MCG/2ML IJ SOLN: 50.0000 ug | INTRAMUSCULAR | Status: DC | PRN

## 2014-09-08 MED ORDER — KETOROLAC TROMETHAMINE 30 MG/ML IJ SOLN
INTRAMUSCULAR | Status: AC
  Administered 2014-09-08: 30 mg
  Filled 2014-09-08: qty 1

## 2014-09-08 MED ORDER — LACTATED RINGERS IV SOLN
INTRAVENOUS | Status: DC | PRN
  Administered 2014-09-08: 10:00:00 via INTRAVENOUS

## 2014-09-08 MED ORDER — PENICILLIN G POTASSIUM 5000000 UNITS IJ SOLR: 5.0000 10*6.[IU] | Freq: Once | INTRAVENOUS | Status: DC

## 2014-09-08 MED ORDER — ONDANSETRON HCL 4 MG/2ML IJ SOLN
INTRAMUSCULAR | Status: AC
  Filled 2014-09-08: qty 2

## 2014-09-08 MED ORDER — SCOPOLAMINE 1 MG/3DAYS TD PT72
1.0000 | MEDICATED_PATCH | Freq: Once | TRANSDERMAL | Status: AC
Start: 2014-09-08 — End: 2014-09-11
  Administered 2014-09-08: 1.5 mg via TRANSDERMAL

## 2014-09-08 MED ORDER — MORPHINE SULFATE (PF) 0.5 MG/ML IJ SOLN
INTRAMUSCULAR | Status: DC | PRN
  Administered 2014-09-08: .2 mg via INTRATHECAL

## 2014-09-08 MED ORDER — SIMETHICONE 80 MG PO CHEW
80.0000 mg | CHEWABLE_TABLET | ORAL | Status: DC
  Administered 2014-09-08 – 2014-09-11 (×3): 80 mg via ORAL
  Filled 2014-09-08 (×4): qty 1

## 2014-09-08 MED ORDER — DIPHENHYDRAMINE HCL 50 MG/ML IJ SOLN
12.5000 mg | INTRAMUSCULAR | Status: DC | PRN
Start: 2014-09-08 — End: 2014-09-11

## 2014-09-08 MED ORDER — SODIUM CHLORIDE 0.9 % IJ SOLN: 3.0000 mL | INTRAMUSCULAR | Status: DC | PRN

## 2014-09-08 MED ORDER — PHENYLEPHRINE 8 MG IN D5W 100 ML (0.08MG/ML) PREMIX OPTIME
INJECTION | INTRAVENOUS | Status: DC | PRN
  Administered 2014-09-08: 60 ug/min via INTRAVENOUS

## 2014-09-08 MED ORDER — SENNOSIDES-DOCUSATE SODIUM 8.6-50 MG PO TABS
2.0000 | ORAL_TABLET | ORAL | Status: DC
  Administered 2014-09-08 – 2014-09-11 (×3): 2 via ORAL
  Filled 2014-09-08 (×3): qty 2

## 2014-09-08 MED ORDER — OXYTOCIN 40 UNITS IN LACTATED RINGERS INFUSION - SIMPLE MED: 62.5000 mL/h | INTRAVENOUS | Status: AC

## 2014-09-08 MED ORDER — CITRIC ACID-SODIUM CITRATE 334-500 MG/5ML PO SOLN
30.0000 mL | ORAL | Status: DC | PRN
  Administered 2014-09-08: 30 mL via ORAL
  Filled 2014-09-08: qty 15

## 2014-09-08 MED ORDER — TERBUTALINE SULFATE 1 MG/ML IJ SOLN: 0.2500 mg | Freq: Once | INTRAMUSCULAR | Status: DC | PRN

## 2014-09-08 MED ORDER — OXYCODONE-ACETAMINOPHEN 5-325 MG PO TABS
1.0000 | ORAL_TABLET | ORAL | Status: DC | PRN
  Administered 2014-09-08: 1 via ORAL
  Filled 2014-09-08: qty 1

## 2014-09-08 MED ORDER — DIBUCAINE 1 % RE OINT: 1.0000 "application " | TOPICAL_OINTMENT | RECTAL | Status: DC | PRN

## 2014-09-08 MED ORDER — LIDOCAINE HCL (PF) 1 % IJ SOLN: 30.0000 mL | INTRAMUSCULAR | Status: DC | PRN

## 2014-09-08 MED ORDER — LACTATED RINGERS IV SOLN
INTRAVENOUS | Status: DC
  Administered 2014-09-08: 20:00:00 via INTRAVENOUS

## 2014-09-08 MED ORDER — NALBUPHINE HCL 10 MG/ML IJ SOLN
5.0000 mg | INTRAMUSCULAR | Status: DC | PRN
  Administered 2014-09-08: 5 mg via SUBCUTANEOUS

## 2014-09-08 MED ORDER — OXYTOCIN 40 UNITS IN LACTATED RINGERS INFUSION - SIMPLE MED
INTRAVENOUS | Status: DC | PRN
  Administered 2014-09-08: 40 [IU] via INTRAVENOUS

## 2014-09-08 MED ORDER — OXYTOCIN BOLUS FROM INFUSION: 500.0000 mL | INTRAVENOUS | Status: DC

## 2014-09-08 MED ORDER — KETOROLAC TROMETHAMINE 30 MG/ML IJ SOLN: 30.0000 mg | Freq: Four times a day (QID) | INTRAMUSCULAR | Status: AC | PRN

## 2014-09-08 MED ORDER — PENICILLIN G POTASSIUM 5000000 UNITS IJ SOLR: 2.5000 10*6.[IU] | INTRAVENOUS | Status: DC

## 2014-09-08 MED ORDER — OXYTOCIN 10 UNIT/ML IJ SOLN
INTRAMUSCULAR | Status: AC
  Filled 2014-09-08: qty 4

## 2014-09-08 MED ORDER — DIPHENHYDRAMINE HCL 25 MG PO CAPS
25.0000 mg | ORAL_CAPSULE | ORAL | Status: DC | PRN
  Administered 2014-09-09: 25 mg via ORAL
  Filled 2014-09-08: qty 1

## 2014-09-08 MED ORDER — NALOXONE HCL 1 MG/ML IJ SOLN: 1.0000 ug/kg/h | INTRAVENOUS | Status: DC | PRN

## 2014-09-08 MED ORDER — LACTATED RINGERS IV SOLN
500.0000 mL | INTRAVENOUS | Status: DC | PRN
  Administered 2014-09-08: 500 mL via INTRAVENOUS

## 2014-09-08 MED ORDER — ACETAMINOPHEN 325 MG PO TABS: 650.0000 mg | ORAL_TABLET | ORAL | Status: DC | PRN

## 2014-09-08 MED ORDER — IBUPROFEN 600 MG PO TABS: 600.0000 mg | ORAL_TABLET | Freq: Four times a day (QID) | ORAL | Status: DC | PRN

## 2014-09-08 MED ORDER — MISOPROSTOL 25 MCG QUARTER TABLET
25.0000 ug | ORAL_TABLET | ORAL | Status: DC | PRN
  Administered 2014-09-08 (×2): 25 ug via VAGINAL
  Filled 2014-09-08 (×2): qty 0.25

## 2014-09-08 MED ORDER — OXYCODONE-ACETAMINOPHEN 5-325 MG PO TABS: 2.0000 | ORAL_TABLET | ORAL | Status: DC | PRN

## 2014-09-08 MED ORDER — MORPHINE SULFATE 0.5 MG/ML IJ SOLN
INTRAMUSCULAR | Status: AC
  Filled 2014-09-08: qty 10

## 2014-09-08 MED ORDER — LACTATED RINGERS IV SOLN
INTRAVENOUS | Status: DC
  Administered 2014-09-08 (×2): via INTRAVENOUS

## 2014-09-08 MED ORDER — OXYTOCIN 40 UNITS IN LACTATED RINGERS INFUSION - SIMPLE MED: 62.5000 mL/h | INTRAVENOUS | Status: DC

## 2014-09-08 MED ORDER — WITCH HAZEL-GLYCERIN EX PADS: 1.0000 "application " | MEDICATED_PAD | CUTANEOUS | Status: DC | PRN

## 2014-09-08 MED ORDER — FENTANYL CITRATE (PF) 100 MCG/2ML IJ SOLN: 25.0000 ug | INTRAMUSCULAR | Status: DC | PRN

## 2014-09-08 MED ORDER — CEFAZOLIN SODIUM-DEXTROSE 2-3 GM-% IV SOLR
INTRAVENOUS | Status: AC
  Filled 2014-09-08: qty 50

## 2014-09-08 MED ORDER — SIMETHICONE 80 MG PO CHEW
80.0000 mg | CHEWABLE_TABLET | ORAL | Status: DC | PRN
  Filled 2014-09-08: qty 1

## 2014-09-08 MED ORDER — ONDANSETRON HCL 4 MG/2ML IJ SOLN
4.0000 mg | Freq: Four times a day (QID) | INTRAMUSCULAR | Status: DC | PRN
  Administered 2014-09-08: 4 mg via INTRAVENOUS

## 2014-09-08 MED ORDER — TETANUS-DIPHTH-ACELL PERTUSSIS 5-2.5-18.5 LF-MCG/0.5 IM SUSP
0.5000 mL | Freq: Once | INTRAMUSCULAR | Status: AC
  Administered 2014-09-09: 0.5 mL via INTRAMUSCULAR

## 2014-09-08 MED ORDER — PRENATAL MULTIVITAMIN CH
1.0000 | ORAL_TABLET | Freq: Every day | ORAL | Status: DC
  Administered 2014-09-09 – 2014-09-11 (×3): 1 via ORAL
  Filled 2014-09-08 (×3): qty 1

## 2014-09-08 MED ORDER — NALBUPHINE HCL 10 MG/ML IJ SOLN: 5.0000 mg | INTRAMUSCULAR | Status: DC | PRN

## 2014-09-08 MED ORDER — BUPIVACAINE IN DEXTROSE 0.75-8.25 % IT SOLN
INTRATHECAL | Status: DC | PRN
  Administered 2014-09-08: 1.4 mL via INTRATHECAL

## 2014-09-08 MED ORDER — NALBUPHINE HCL 10 MG/ML IJ SOLN
INTRAMUSCULAR | Status: AC
  Filled 2014-09-08: qty 1

## 2014-09-08 MED ORDER — PHENYLEPHRINE 40 MCG/ML (10ML) SYRINGE FOR IV PUSH (FOR BLOOD PRESSURE SUPPORT)
PREFILLED_SYRINGE | INTRAVENOUS | Status: AC
  Filled 2014-09-08: qty 10

## 2014-09-08 SURGICAL SUPPLY — 30 items
BENZOIN TINCTURE PRP APPL 2/3 (GAUZE/BANDAGES/DRESSINGS) ×2 IMPLANT
CLAMP CORD UMBIL (MISCELLANEOUS) IMPLANT
CLOTH BEACON ORANGE TIMEOUT ST (SAFETY) ×2 IMPLANT
CONTAINER PREFILL 10% NBF 15ML (MISCELLANEOUS) IMPLANT
DRAPE SHEET LG 3/4 BI-LAMINATE (DRAPES) IMPLANT
DRSG OPSITE POSTOP 4X10 (GAUZE/BANDAGES/DRESSINGS) ×2 IMPLANT
DURAPREP 26ML APPLICATOR (WOUND CARE) ×2 IMPLANT
ELECT REM PT RETURN 9FT ADLT (ELECTROSURGICAL) ×2
ELECTRODE REM PT RTRN 9FT ADLT (ELECTROSURGICAL) ×1 IMPLANT
EXTRACTOR VACUUM M CUP 4 TUBE (SUCTIONS) IMPLANT
GLOVE BIO SURGEON STRL SZ7.5 (GLOVE) ×2 IMPLANT
GLOVE BIOGEL PI IND STRL 7.5 (GLOVE) ×1 IMPLANT
GLOVE BIOGEL PI INDICATOR 7.5 (GLOVE) ×1
GOWN STRL REUS W/TWL LRG LVL3 (GOWN DISPOSABLE) ×4 IMPLANT
KIT ABG SYR 3ML LUER SLIP (SYRINGE) IMPLANT
NEEDLE HYPO 25X5/8 SAFETYGLIDE (NEEDLE) IMPLANT
NS IRRIG 1000ML POUR BTL (IV SOLUTION) ×2 IMPLANT
PACK C SECTION WH (CUSTOM PROCEDURE TRAY) ×2 IMPLANT
PAD OB MATERNITY 4.3X12.25 (PERSONAL CARE ITEMS) ×2 IMPLANT
RTRCTR C-SECT PINK 25CM LRG (MISCELLANEOUS) ×2 IMPLANT
STRIP CLOSURE SKIN 1/2X4 (GAUZE/BANDAGES/DRESSINGS) ×2 IMPLANT
SUT CHROMIC 2 0 CT 1 (SUTURE) ×2 IMPLANT
SUT MNCRL AB 3-0 PS2 27 (SUTURE) ×2 IMPLANT
SUT PLAIN 0 NONE (SUTURE) IMPLANT
SUT PLAIN 2 0 XLH (SUTURE) ×2 IMPLANT
SUT VIC AB 0 CT1 36 (SUTURE) ×4 IMPLANT
SUT VIC AB 0 CTX 36 (SUTURE) ×3
SUT VIC AB 0 CTX36XBRD ANBCTRL (SUTURE) ×3 IMPLANT
TOWEL OR 17X24 6PK STRL BLUE (TOWEL DISPOSABLE) ×2 IMPLANT
TRAY FOLEY CATH SILVER 14FR (SET/KITS/TRAYS/PACK) ×2 IMPLANT

## 2014-09-08 NOTE — Transfer of Care (Signed)
Immediate Anesthesia Transfer of Care Note  Patient: Caroline Yang  Procedure(s) Performed: Procedure(s): CESAREAN SECTION (N/A)  Patient Location: PACU  Anesthesia Type:Spinal  Level of Consciousness: awake, alert  and oriented  Airway & Oxygen Therapy: Patient Spontanous Breathing  Post-op Assessment: Report given to RN and Post -op Vital signs reviewed and stable  Post vital signs: Reviewed and stable  Last Vitals:  Filed Vitals:   09/08/14 0810  BP: 117/52  Pulse: 72  Temp:   Resp: 18    Complications: No apparent anesthesia complications

## 2014-09-08 NOTE — Addendum Note (Signed)
Addendum  created 09/08/14 1308 by Talbot Grumbling, CRNA   Modules edited: Notes Section   Notes Section:  File: [id number]

## 2014-09-08 NOTE — Anesthesia Postprocedure Evaluation (Signed)
  Anesthesia Post-op Note  Patient: Caroline Yang  Procedure(s) Performed: Procedure(s): CESAREAN SECTION (N/A)  Patient Location: PACU  Anesthesia Type:Spinal  Level of Consciousness: awake, alert  and oriented  Airway and Oxygen Therapy: Patient Spontanous Breathing  Post-op Pain: none  Post-op Assessment: Post-op Vital signs reviewed, Patient's Cardiovascular Status Stable, Respiratory Function Stable, No signs of Nausea or vomiting and Pain level controlled LLE Motor Response: Purposeful movement LLE Sensation: Increased, Tingling RLE Motor Response: Purposeful movement RLE Sensation: Increased, Tingling      Post-op Vital Signs: Reviewed  Last Vitals:  Filed Vitals:   09/08/14 1130  BP: 104/63  Pulse: 88  Temp: 36.6 C  Resp: 30    Complications: No apparent anesthesia complications

## 2014-09-08 NOTE — Op Note (Signed)
Cesarean Section Procedure Note  Indications: 37wks IUGR undergoing induction   Pre-operative Diagnosis: CESAREAN SECTION non-reassuring fetal heart rate   Post-operative Diagnosis: CESAREAN SECTION non-reassuring fetal heart rate  Procedure: PRIMARY LOW TRANSVERSE CESAREAN SECTION  Surgeon: Everett Graff, MD    Assistants: Donnel Saxon, CNM  Anesthesia: Regional  Anesthesiologist: Jillyn Hidden, MD   Procedure Details  The patient was taken to the operating room secondary to IUGR and inability to tolerated labor after the risks, benefits, complications, treatment options, and expected outcomes were discussed with the patient.  The patient concurred with the proposed plan, giving informed consent which was signed and witnessed. The patient was taken to Operating Room 1, identified as [name] and the procedure verified as C-Section Delivery. A Time Out was held and the above information confirmed.  After induction of anesthesia by obtaining a spinal, the patient was prepped and draped in the usual sterile manner. A Pfannenstiel skin incision was made and carried down through the subcutaneous tissue to the underlying layer of fascia.  The fascia was incised bilaterally and extended transversely bilaterally with the Mayo scissors. Kocher clamps were placed on the inferior aspect of the fascial incision and the underlying rectus muscle was separated from the fascia. The same was done on the superior aspect of the fascial incision.  The peritoneum was identified, entered bluntly and extended manually.  An Alexis self-retaining retractor was placed.  The utero-vesical peritoneal reflection was incised transversely and the bladder flap was bluntly freed from the lower uterine segment. A low transverse uterine incision was made with the scalpel and extended bilaterally with the bandage scissors.  The infant was delivered in vertex position without difficulty.  After the umbilical cord was  clamped and cut, the infant was handed to the awaiting pediatricians.  Cord blood was obtained for evaluation.  The placenta was removed intact and appeared to be within normal limits. The uterus was cleared of all clots and debris. The uterine incision was closed with running interlocking sutures of 0 Vicryl and a second imbricating layer was performed as well.   Bilateral tubes and ovaries appeared to be within normal limits.  Good hemostasis was noted.  Copious irrigation was performed until clear.  The peritoneum was repaired with 2-0 chromic via a running suture.  The fascia was reapproximated with a running suture of 0 Vicryl. The subcutaneous tissue was reapproximated with 3 interrupted sutures of 2-0 plain.  The skin was reapproximated with a subcuticular suture of 3-0 monocryl.  Steristrips were applied.  Instrument, sponge, and needle counts were correct prior to abdominal closure and at the conclusion of the case.  The patient was awaiting transfer to the recovery room in good condition.  Findings: Live female infant with Apgars 8 at one minute, 8 at five minutes and 9 at 10 minutes.  Normal appearing bilateral ovaries and fallopian tubes were noted.  Rt posterior fibroid about 2cm near the rt adnexa.  Estimated Blood Loss:  600 ml         Drains: foley to gravity 200 cc         Total IV Fluids: 2300 ml         Specimens to Pathology: Placenta         Complications:  None; patient tolerated the procedure well.         Disposition: PACU - hemodynamically stable.         Condition: stable  Attending Attestation: I performed the procedure.

## 2014-09-08 NOTE — Lactation Note (Signed)
This note was copied from the chart of Caroline Yang. Lactation Consultation Note  Patient Name: Caroline Yang [id number] Date: 09/08/2014 Reason for consult: Initial assessment;NICU baby;Infant < 6lbs;Other (Comment) (SGA)   Mom is an experienced breast feeder. I set up a DEP and got mom pumping in premie setting, and showed her how to hand express. Mom expressed between 1-2 mls of colostrum. NICU book on providing EBM for your baby reviewed with mom. Mom from [place], but speaks English, and dad in the room - he is more fluent in [place] than mom. I sent a fax to [facility] for mom to get a DEP. Mom may need to loan a WIC DEP at discharge. Mom knows to call for questions/concerns.    Maternal Data Formula Feeding for Exclusion: Yes (baby in NICU) Has patient been taught Hand Expression?: Yes Does the patient have breastfeeding experience prior to this delivery?: Yes  Feeding    LATCH Score/Interventions                      Lactation Tools Discussed/Used WIC Program: No (fax sent for mom ot aply to [facility] and get a DEP) Pump Review: Setup, frequency, and cleaning;Milk Storage;Other (comment) Initiated by:: Franky Macho , RN, IBCLC Date initiated:: 09/08/14   Consult Status Consult Status: Follow-up Date: 09/09/14 Follow-up type: In-patient    Tonna Corner 09/08/2014, 5:19 PM

## 2014-09-08 NOTE — Progress Notes (Signed)
  Subjective: Aware of contractions, some painful.  Husband at bedside.  Objective: BP 110/55 mmHg  Pulse 83  Temp(Src) 97.9 F (36.6 C) (Oral)  Resp 18  Ht 5\' 2"  (1.575 m)  Wt 87.544 kg (193 lb)  BMI 35.29 kg/m2  LMP 12/22/2013      FHT: Category 2--repetitive late decels since approx 0745, unresponsive to position change or O2, fluid bolus. UC:   regular, every 3 minutes, mild. SVE:  FT, thick, ballotable, vtx at 0615  Received 2nd dose Cytotech at 0617  Assessment:  IUP at 37 1/7 weeks Severe IUGR Non-reassuring FHR s/p Cytotech GBS positive  Plan: Reviewed FHR status with Dr. Roberts--C/s recommended. R&B of C/S reviewed with patient and husband, including bleeding, infection, and damage to other organs, and risks of anesthesia.  Patient and husband seem to understand the recommendation for C/S, with R&B, and are agreeable with proceeding. Ancef 2 gm IV on call to OR.  Donnel Saxon CNM 09/08/2014, 8:25 AM

## 2014-09-08 NOTE — Anesthesia Procedure Notes (Signed)
Spinal Patient location during procedure: OR Start time: 09/08/2014 9:10 AM Staffing Anesthesiologist: Won Kreuzer Performed by: anesthesiologist  Preanesthetic Checklist Completed: patient identified, site marked, surgical consent, pre-op evaluation, timeout performed, IV checked, risks and benefits discussed and monitors and equipment checked Spinal Block Patient position: sitting Prep: Betadine Patient monitoring: heart rate, continuous pulse ox and blood pressure Location: L4-5 Injection technique: single-shot Needle Needle type: Spinocan  Needle gauge: 22 G Needle length: 9 cm Assessment Sensory level: T4 Events: paresthesia and relieved prior to injection of medication Additional Notes Expiration date of kit checked and confirmed. Patient tolerated procedure well, without complications.

## 2014-09-08 NOTE — Anesthesia Postprocedure Evaluation (Signed)
Anesthesia Post Note  Patient: Caroline Yang  Procedure(s) Performed: Procedure(s) (LRB): CESAREAN SECTION (N/A)  Anesthesia type: SAB  Patient location: Mother/Baby  Post pain: Pain level controlled  Post assessment: Post-op Vital signs reviewed  Last Vitals:  Filed Vitals:   09/08/14 1215  BP: 108/47  Pulse: 64  Temp: 36.8 C  Resp: 18    Post vital signs: Reviewed  Level of consciousness: awake  Complications: No apparent anesthesia complications

## 2014-09-08 NOTE — Anesthesia Preprocedure Evaluation (Signed)
Anesthesia Evaluation  Patient identified by MRN, date of birth, ID band Patient awake    Reviewed: Allergy & Precautions, NPO status , Patient's Chart, lab work & pertinent test results  Airway Mallampati: II       Dental no notable dental hx. (+) Teeth Intact   Pulmonary neg pulmonary ROS,  breath sounds clear to auscultation  Pulmonary exam normal       Cardiovascular negative cardio ROS Normal cardiovascular examRhythm:Regular Rate:Normal     Neuro/Psych negative neurological ROS  negative psych ROS   GI/Hepatic Neg liver ROS, GERD-  ,  Endo/Other  Obesity  Renal/GU Renal diseaseHx/o renal calculi  negative genitourinary   Musculoskeletal   Abdominal (+) + obese,   Peds  Hematology  (+) anemia ,   Anesthesia Other Findings   Reproductive/Obstetrics (+) Pregnancy Non reassuring FHR  IUGR Oligohydramnios                             Anesthesia Physical Anesthesia Plan  ASA: II  Anesthesia Plan: Spinal   Post-op Pain Management:    Induction:   Airway Management Planned: Natural Airway  Additional Equipment:   Intra-op Plan:   Post-operative Plan:   Informed Consent: I have reviewed the patients History and Physical, chart, labs and discussed the procedure including the risks, benefits and alternatives for the proposed anesthesia with the patient or authorized representative who has indicated his/her understanding and acceptance.     Plan Discussed with: Anesthesiologist, CRNA and Surgeon  Anesthesia Plan Comments:         Anesthesia Quick Evaluation

## 2014-09-08 NOTE — Progress Notes (Signed)
Caroline Yang, [id number]   Subjective -Patient ready to start induction process.  Husband at bedside.  Patient continues to deny contractions, VB, and LoF.   Objective Filed Vitals:   09/08/14 0142  BP: 116/65  Pulse: 85  Temp: 98.2 F (36.8 C)  Resp: 16        Physical Exam  Genitourinary: Thin  odorless  white and vaginal discharge found.  Female Circumcision Type 2    FMB:[id number]: Closed Effacement (%): Thick Cervical Position: Posterior Station: Ballotable Presentation: Vertex (Confirmed via BS [place]) Exam by:: J.Inita Uram  Pelvis: -Proven:Yes to 6lbs 8oz -Adequate: Yes Leopolds: -Position:Vertex by [place] -EFW:5 1/2lbs  Monitoring: FHR: 140 bpm, Mod Var, -Decels, +Accels UC:  None graphed or palapted  Induction/Augmentation Agent: Pitocin: None Cytotec: 1st Dose at 0215 Membranes: Intact  Assessment IUP at 37.1wks Cat I FT Bishop Score: 2 Cervical Ripening  Plan -Bedside ultrasound performed to confirm vertex position. -Discussed r/b of induction including fetal distress, serial induction, pain, and increased risk of c/s delivery -Discussed induction methods including cervical ripening agents, foley bulbs, and pitocin -Questions and concerns addressed -Continue other mgmt as ordered  [name] LYNN, CNM 09/08/2014, 2:16 AM

## 2014-09-09 ENCOUNTER — Encounter (HOSPITAL_COMMUNITY): Payer: Self-pay | Admitting: Obstetrics and Gynecology

## 2014-09-09 DIAGNOSIS — Z98891 History of uterine scar from previous surgery: Secondary | ICD-10-CM

## 2014-09-09 LAB — CBC
HCT: 29.6 % — ABNORMAL LOW (ref 36.0–46.0)
Hemoglobin: 9.4 g/dL — ABNORMAL LOW (ref 12.0–15.0)
MCH: 25.8 pg — ABNORMAL LOW (ref 26.0–34.0)
MCHC: 31.8 g/dL (ref 30.0–36.0)
MCV: 81.3 fL (ref 78.0–100.0)
PLATELETS: 286 10*3/uL (ref 150–400)
RBC: 3.64 MIL/uL — ABNORMAL LOW (ref 3.87–5.11)
RDW: 17.8 % — ABNORMAL HIGH (ref 11.5–15.5)
WBC: 6.6 10*3/uL (ref 4.0–10.5)

## 2014-09-09 NOTE — Lactation Note (Signed)
This note was copied from the chart of Caroline Mitsy Owen. Lactation Consultation Note  Initial follow up visit made.  Mom just pumped and obtained 10-15 mls of transitional milk.  Mom is pumping every 3 hours on standard setting.  Reviewed pump usage.  No questions or concerns at present.  Encouraged to call with questions/concerns prn.  Patient Name: Caroline Yang [id number] Date: 09/09/2014     Maternal Data    Feeding Feeding Type: Formula Length of feed: 15 min  LATCH Score/Interventions                      Lactation Tools Discussed/Used     Consult Status      Ave Filter 09/09/2014, 10:29 AM

## 2014-09-09 NOTE — Progress Notes (Signed)
Caroline Yang [id number]  Subjective: Postpartum Day 1: Primary LTC/S due to [facility], severe IUGR Patient up ad lib, reports no syncope or dizziness. Feeding:  Plans breastfeeding, but baby not able to breastfeed at present.  Patient working with [facility] in NICU Contraceptive plan:  Undecided  Baby doing well in NICU--now on room air  Objective: Temp:  [97.6 F (36.4 C)-98.6 F (37 C)] 97.8 F (36.6 C) (07/06 0512) Pulse Rate:  [55-88] 59 (07/06 0512) Resp:  [15-30] 18 (07/06 0512) BP: (94-125)/(47-88) 98/56 mmHg (07/06 0512) SpO2:  [97 %-100 %] 100 % (07/06 0512)   Recent Labs  09/08/14 0130 09/09/14 0512  HGB 11.0* 9.4*  HCT 34.1* 29.6*  WBC 7.4 6.6   Orthostatics stable  Physical Exam:  General: alert Lochia: appropriate Uterine Fundus: firm Abdomen:  + bowel sounds Incision: Honeycomb dressing CDI DVT Evaluation: No evidence of DVT seen on physical exam. Negative Homan's sign.   Assessment/Plan: Status post cesarean delivery, day 1 Hx severe IUGR, NRFHR. Baby in NICU--doing well Stable Continue current care.     Donnel Saxon MSN, CNM 09/09/2014, 9:49 AM

## 2014-09-10 NOTE — Clinical Social Work Maternal (Signed)
CLINICAL SOCIAL WORK MATERNAL/CHILD NOTE  Patient Details  Name: Caroline Yang MRN: [id number] Date of Birth: 09-18-1980  Date:  09/10/2014  Clinical Social Worker Initiating Note:  Ellsie Violette E. Brigitte Pulse, [employer] Date/ Time Initiated:  09/10/14/1130     Child's Name:  Caroline Yang   Legal Guardian:   (Parents: [name] and Curly Shores)   Need for Interpreter:  Other (Comment Required) (Family's first language is Arabic.  FOB speaks [place] fluently and MOB speaks limited [place].  They declined offer for interpreter, but understand that they may ask for one at any time.)   Date of Referral:        Reason for Referral:   (No referral-NICU admission)   Referral Source:      Address:  [address] [place], [place], [place] [address]  Phone number:  [phone]   Household Members:  Spouse, Minor Children (Couple has two sons: Starleen Arms, age 27 and 55, age 38)   Natural Supports (not living in the home):  Neighbors, Friends, Extended Family, Immediate Family (Parents reports a great support system of family and neighbors close by.  Their immediate families live in [place] and cousins live in [place], [place], and [place], with the closest relatives in the [place] area.)   Professional Supports:     Employment:     Type of Work:  (FOB works at [employer]. on [employer].)   Education:      Museum/gallery curator Resources:  Medicaid   Other Resources:      Cultural/Religious Considerations Which May Impact Care: None stated  Strengths:  Ability to meet basic needs , Compliance with medical plan , Home prepared for child , Understanding of illness, Pediatrician chosen  (Parents report that pediatric follow up will be with Dr. Anastasio Champion)   Risk Factors/Current Problems:  None   Cognitive State:  Alert , Insightful , Linear Thinking    Mood/Affect:  Interested , Comfortable , Relaxed , Calm    CSW Assessment: CSW met with parents in MOB's third floor room to introduce myself, offer  support and complete assessment due to baby's admission to NICU.  CSW recognized MOB as a patient CSW met with on An[facility].  MOB states recognition as well.  Parents were very friendly and welcoming of CSW's visit.  FOB seemed especially appreciative of the information given and support offered.   Parents report that they have "prepared" themselves for baby's admission to NICU since they were aware that she would be small at birth.  They feel they are coping well at this time, however, acknowledge the sadness that separation brings.  This is their first experience with a baby admitted to NICU.  FOB states that their first son was born in [place] and their second son was born here, but neither required intensive care.  They report no emotional concerns at this time.  CSW discussed common emotions often experienced during the first two weeks after delivery, as well as provided education regarding signs and symptoms of PPD to watch for.  MOB states no hx and commits to talking with CSW and or her doctor if symptoms arise.    They report having everything they need for baby at home and a good support system.  CSW recalls that MGM was in the process of applying for a visitor Visa when CSW initially met with MOB and the couple explained that this was denied.  They report that MGM will attempt again in the winter, when they are more  readily approved.   CSW explained baby's eligibility for Supplemental Security Income through the [employer] due to her birth weight and gestation.  FOB appears interested in applying.  CSW obtained FOB's signature on a authorization to disclose protected health information from baby's medial record and provided him with a copy of baby's admission summary documenting gestation and birth weight.  FOB was very appreciative and states understanding of the application process.   CSW explained ongoing support services offered by NICU CSW while baby is in NICU and the  importance of monitoring emotional needs.  CSW provided contact information and asked parents to call any time.    CSW Plan/Description:  Engineer, mining , Psychosocial Support and Ongoing Assessment of Needs, Information/Referral to [place], [place], [place] 09/10/2014, 4:00 PM

## 2014-09-10 NOTE — Progress Notes (Addendum)
Caroline Yang [id number]  Subjective: Postpartum Day 2: Primary LTC/S due to [facility], severe IUGR Sleeping soundly at present Feeding:  Pumping Contraceptive plan:  Declines at present  RN reports no issues for patient overnight.  Baby stable in NICU--on RA  Objective: Temp:  [97.9 F (36.6 C)-98.7 F (37.1 C)] 98.7 F (37.1 C) (07/07 0540) Pulse Rate:  [77-85] 77 (07/07 0540) Resp:  [16-27] 27 (07/07 0540) BP: (103-118)/(58-74) 114/62 mmHg (07/07 0540)  Filed Vitals:   09/09/14 1000 09/09/14 1400 09/09/14 1700 09/10/14 0540  BP: 109/68 118/74 103/58 114/62  Pulse: 80 85 80 77  Temp: 98.9 F (37.2 C) 98.3 F (36.8 C) 97.9 F (36.6 C) 98.7 F (37.1 C)  TempSrc: Oral Oral Oral Oral  Resp: 18 18 16 27   Height:      Weight:      SpO2:        Recent Labs  09/08/14 0130 09/09/14 0512  HGB 11.0* 9.4*  HCT 34.1* 29.6*  WBC 7.4 6.6    Physical Exam deferred at present due to patient sleeping.  Assessment/Plan: Status post cesarean delivery, day 2 NICU infant due to severe IUGR. Stable Will see for PE when patient awakens Continue current care. Plan for discharge tomorrow    Donnel Saxon MSN, CNM 09/10/2014, 10:01 AM  Addendum: Now awake, family at bedside.  Physical Exam:  General: alert Lochia: appropriate Uterine Fundus: firm Abdomen:  + bowel sounds Incision: Honeycomb dressing CDI DVT Evaluation: No evidence of DVT seen on physical exam. Negative Homan's sign.  Assessment/Plan: Day 2 post-op C/S NICU infant due to severe IUGR--doing well Patient aware of plan for d/c tomorrow.  Donnel Saxon MSN, CNM 09/10/2014, 1:09 PM   I saw patient in NICU and agree with above findings, assessment and plan.  Dr. Alesia Richards.

## 2014-09-11 MED ORDER — FERROUS SULFATE 325 (65 FE) MG PO TBEC: 325.0000 mg | DELAYED_RELEASE_TABLET | Freq: Two times a day (BID) | ORAL | Status: DC

## 2014-09-11 MED ORDER — IBUPROFEN 600 MG PO TABS: 600.0000 mg | ORAL_TABLET | Freq: Four times a day (QID) | ORAL | Status: DC | PRN

## 2014-09-11 MED ORDER — OXYCODONE-ACETAMINOPHEN 5-325 MG PO TABS: 1.0000 | ORAL_TABLET | ORAL | Status: DC | PRN

## 2014-09-11 NOTE — Lactation Note (Signed)
This note was copied from the chart of Caroline Yang. Lactation Consultation Note  Follow up made prior to discharge.  Mom is pleased her milk is in and volumes are increasing.  [place] appointment is 09/14/14 so loaner done prior to discharge.  Mom states baby is latching well.  Encouraged to breastfeed as much as possible.  She will call with concerns/assist prn.  Patient Name: Caroline Yang [id number] Date: 09/11/2014     Maternal Data    Feeding    LATCH Score/Interventions                      Lactation Tools Discussed/Used     Consult Status      Ave Filter 09/11/2014, 3:06 PM

## 2014-09-11 NOTE — Discharge Summary (Signed)
Cesarean Section Delivery Discharge Summary  Caroline Yang  DOB:    03/27/1980 MRN:    [id number] CSN:    [id number]  Date of admission:                  09/08/14  Date of discharge:                   09/11/14  Procedures this admission:  primary CS  Date of Delivery: 09/08/14  Newborn Data:  Live born  Information for the patient's newborn:  Caroline, Simmon Girl Yang [[id number]]  female   Live born female  Birth Weight: 3 lb 13.4 oz (1740 g) APGAR: 8, 8  Home with mother. Name: Caroline Yang   History of Present Illness:  Caroline Yang is a 34 y.o. female, [id number], who presents at [redacted]w[redacted]d weeks gestation. The patient has been followed at the [facility] and Gynecology division of [facility].    Her pregnancy has been complicated by:  Patient Active Problem List   Diagnosis Date Noted  . Status post primary low transverse cesarean section 09/09/2014  . IUGR (intrauterine growth restriction) 07/08/2014  . Oligohydramnios 07/08/2014  . Positive GBS test 07/08/2014    Hospital course: The patient was admitted to [facility] for severe IUGR, severe oligo 0.949, BPP 6/8.   Her postpartum course was not complicated.  She was discharged to home on postpartum day 3 doing well.  Feeding: breast  Contraception: unsure Pt understands the risks are but not limited to irregular bleeding, formation of DVT, fluid fluctuations, elevation in blood pressure, stroke, breast tenderness and liver damage.  She states she will report any serious side effects.  She has been given verbal and written instructions and voiced a clear understanding.    Discharge hemoglobin: HEMOGLOBIN  Date Value Ref Range Status  09/09/2014 9.4* 12.0 - 15.0 g/dL Final  04/22/2012 12.7 12.2 - 16.2 g/dL Final   HCT  Date Value Ref Range Status  09/09/2014 29.6* 36.0 - 46.0 % Final   HCT, POC  Date Value Ref Range Status  04/22/2012 40.4 37.7 - 47.9 % Final   Anemia - hemodynamicly  stable.   PreNatal Labs ABO, Rh: --/--/A POS (07/05 0130)   Antibody: NEG (07/05 0130) Rubella:    immune RPR: Non Reactive (07/05 0130)  HBsAg: Negative (11/04 0000)  HIV: Non-reactive (11/04 0000)  GBS: Positive (05/04 1841)  Discharge Physical Exam:  General: alert and cooperative Lochia: appropriate Uterine Fundus: firm Incision: no significant drainage DVT Evaluation: No evidence of DVT seen on physical exam.  Intrapartum Procedures: cesarean: low cervical, transverse Postpartum Procedures: none Complications-Operative and Postpartum: none  Discharge Diagnoses: Near term delivery,  asymptomatic anemia  Discharge Information:  Activity:           pelvic rest Diet:                routine Medications: PNV, Ibuprofen, Iron and Percocet Condition:      stable   Discharge to: home  Follow-up Information    Follow up with [facility] In 6 weeks.   Specialty:  Obstetrics and Gynecology   Why:  Postpartum check up   Contact information:   [address] [place] [address] [phone]       Caroline Yang, CNM, MSN  09/11/2014. 7:54 AM  Care After Cesarean Delivery  Refer to this sheet in the next few weeks. These instructions provide you with information  on caring for yourself after your procedure. Your caregiver may also give you specific instructions. Your treatment has been planned according to current medical practices, but problems sometimes occur. Call your caregiver if you have any problems or questions after you go home. HOME CARE INSTRUCTIONS  Only take over-the-counter or prescription medicines as directed by your caregiver.  Do not drink alcohol, especially if you are breastfeeding or taking medicine to relieve pain.  Do not chew or smoke tobacco.  Continue to use good perineal care. Good perineal care includes:  Wiping your perineum from front to back.  Keeping your perineum  clean.  Check your cut (incision) daily for increased redness, drainage, swelling, or separation of skin.  Clean your incision gently with soap and water every day, and then pat it dry. If your caregiver says it is okay, leave the incision uncovered. Use a bandage (dressing) if the incision is draining fluid or appears irritated. If the adhesive strips across the incision do not fall off within 7 days, carefully peel them off.  Hug a pillow when coughing or sneezing until your incision is healed. This helps to relieve pain.  Do not use tampons or douche until your caregiver says it is okay.  Shower, wash your hair, and take tub baths as directed by your caregiver.  Wear a well-fitting bra that provides breast support.  Limit wearing support panties or control-top hose.  Drink enough fluids to keep your urine clear or pale yellow.  Eat high-fiber foods such as whole grain cereals and breads, brown rice, beans, and fresh fruits and vegetables every day. These foods may help prevent or relieve constipation.  Resume activities such as climbing stairs, driving, lifting, exercising, or traveling as directed by your caregiver.  Talk to your caregiver about resuming sexual activities. This is dependent upon your risk of infection, your rate of healing, and your comfort and desire to resume sexual activity.  Try to have someone help you with your household activities and your newborn for at least a few days after you leave the hospital.  Rest as much as possible. Try to rest or take a nap when your newborn is sleeping.  Increase your activities gradually.  Keep all of your scheduled postpartum appointments. It is very important to keep your scheduled follow-up appointments. At these appointments, your caregiver will be checking to make sure that you are healing physically and emotionally. SEEK MEDICAL CARE IF:   You are passing large clots from your vagina. Save any clots to show your  caregiver.  You have a foul smelling discharge from your vagina.  You have trouble urinating.  You are urinating frequently.  You have pain when you urinate.  You have a change in your bowel movements.  You have increasing redness, pain, or swelling near your incision.  You have pus draining from your incision.  Your incision is separating.  You have painful, hard, or reddened breasts.  You have a severe headache.  You have blurred vision or see spots.  You feel sad or depressed.  You have thoughts of hurting yourself or your newborn.  You have questions about your care, the care of your newborn, or medicines.  You are dizzy or lightheaded.  You have a rash.  You have pain, redness, or swelling at the site of the removed intravenous access (IV) tube.  You have nausea or vomiting.  You stopped breastfeeding and have not had a menstrual period within 12 weeks of stopping.  You  are not breastfeeding and have not had a menstrual period within 12 weeks of delivery.  You have a fever. SEEK IMMEDIATE MEDICAL CARE IF:  You have persistent pain.  You have chest pain.  You have shortness of breath.  You faint.  You have leg pain.  You have stomach pain.  Your vaginal bleeding saturates 2 or more sanitary pads in 1 hour. MAKE SURE YOU:   Understand these instructions.  Will watch your condition.  Will get help right away if you are not doing well or get worse. Document Released: 11/12/2001 Document Revised: 11/15/2011 Document Reviewed: 10/18/2011 [facility] Patient Information 2014 [place].   Postpartum Depression and Baby Blues  The postpartum period begins right after the birth of a baby. During this time, there is often a great amount of joy and excitement. It is also a time of considerable changes in the life of the parent(s). Regardless of how many times a mother gives birth, each child brings new challenges and dynamics to the family. It is not  unusual to have feelings of excitement accompanied by confusing shifts in moods, emotions, and thoughts. All mothers are at risk of developing postpartum depression or the "baby blues." These mood changes can occur right after giving birth, or they may occur many months after giving birth. The baby blues or postpartum depression can be mild or severe. Additionally, postpartum depression can resolve rather quickly, or it can be a long-term condition. CAUSES Elevated hormones and their rapid decline are thought to be a main cause of postpartum depression and the baby blues. There are a number of hormones that radically change during and after pregnancy. Estrogen and progesterone usually decrease immediately after delivering your baby. The level of thyroid hormone and various cortisol steroids also rapidly drop. Other factors that play a major role in these changes include major life events and genetics.  RISK FACTORS If you have any of the following risks for the baby blues or postpartum depression, know what symptoms to watch out for during the postpartum period. Risk factors that may increase the likelihood of getting the baby blues or postpartum depression include: 1. Havinga personal or family history of depression. 2. Having depression while being pregnant. 3. Having premenstrual or oral contraceptive-associated mood issues. 4. Having exceptional life stress. 5. Having marital conflict. 6. Lacking a social support network. 7. Having a baby with special needs. 8. Having health problems such as diabetes. SYMPTOMS Baby blues symptoms include:  Brief fluctuations in mood, such as going from extreme happiness to sadness.  Decreased concentration.  Difficulty sleeping.  Crying spells, tearfulness.  Irritability.  Anxiety. Postpartum depression symptoms typically begin within the first month after giving birth. These symptoms include:  Difficulty sleeping or excessive sleepiness.  Marked  weight loss.  Agitation.  Feelings of worthlessness.  Lack of interest in activity or food. Postpartum psychosis is a very concerning condition and can be dangerous. Fortunately, it is rare. Displaying any of the following symptoms is cause for immediate medical attention. Postpartum psychosis symptoms include:  Hallucinations and delusions.  Bizarre or disorganized behavior.  Confusion or disorientation. DIAGNOSIS  A diagnosis is made by an evaluation of your symptoms. There are no medical or lab tests that lead to a diagnosis, but there are various questionnaires that a caregiver may use to identify those with the baby blues, postpartum depression, or psychosis. Often times, a screening tool called the [place] Postnatal Depression Scale is used to diagnose depression in the postpartum period.  TREATMENT The baby blues usually goes away on its own in 1 to 2 weeks. Social support is often all that is needed. You should be encouraged to get adequate sleep and rest. Occasionally, you may be given medicines to help you sleep.  Postpartum depression requires treatment as it can last several months or longer if it is not treated. Treatment may include individual or group therapy, medicine, or both to address any social, physiological, and psychological factors that may play a role in the depression. Regular exercise, a healthy diet, rest, and social support may also be strongly recommended.  Postpartum psychosis is more serious and needs treatment right away. Hospitalization is often needed. HOME CARE INSTRUCTIONS  Get as much rest as you can. Nap when the baby sleeps.  Exercise regularly. Some women find yoga and walking to be beneficial.  Eat a balanced and nourishing diet.  Do little things that you enjoy. Have a cup of tea, take a bubble bath, read your favorite magazine, or listen to your favorite music.  Avoid alcohol.  Ask for help with household chores, cooking, grocery shopping,  or running errands as needed. Do not try to do everything.  Talk to people close to you about how you are feeling. Get support from your partner, family members, friends, or other new moms.  Try to stay positive in how you think. Think about the things you are grateful for.  Do not spend a lot of time alone.  Only take medicine as directed by your caregiver.  Keep all your postpartum appointments.  Let your caregiver know if you have any concerns. SEEK MEDICAL CARE IF: You are having a reaction or problems with your medicine. SEEK IMMEDIATE MEDICAL CARE IF:  You have suicidal feelings.  You feel you may harm the baby or someone else. Document Released: 11/25/2003 Document Revised: 05/15/2011 Document Reviewed: 12/27/2010 [facility] Patient Information 2014 [place], [place].   Breastfeeding Deciding to breastfeed is one of the best choices you can make for you and your baby. A change in hormones during pregnancy causes your breast tissue to grow and increases the number and size of your milk ducts. These hormones also allow proteins, sugars, and fats from your blood supply to make breast milk in your milk-producing glands. Hormones prevent breast milk from being released before your baby is born as well as prompt milk flow after birth. Once breastfeeding has begun, thoughts of your baby, as well as his or her sucking or crying, can stimulate the release of milk from your milk-producing glands.  BENEFITS OF BREASTFEEDING For Your Baby  Your first milk (colostrum) helps your baby's digestive system function better.   There are antibodies in your milk that help your baby fight off infections.   Your baby has a lower incidence of asthma, allergies, and sudden infant death syndrome.   The nutrients in breast milk are better for your baby than infant formulas and are designed uniquely for your baby's needs.   Breast milk improves your baby's brain development.   Your baby is less  likely to develop other conditions, such as childhood obesity, asthma, or type 2 diabetes mellitus.  For You   Breastfeeding helps to create a very special bond between you and your baby.   Breastfeeding is convenient. Breast milk is always available at the correct temperature and costs nothing.   Breastfeeding helps to burn calories and helps you lose the weight gained during pregnancy.   Breastfeeding makes your uterus contract to its  prepregnancy size faster and slows bleeding (lochia) after you give birth.   Breastfeeding helps to lower your risk of developing type 2 diabetes mellitus, osteoporosis, and breast or ovarian cancer later in life. SIGNS THAT YOUR BABY IS HUNGRY Early Signs of Hunger  Increased alertness or activity.  Stretching.  Movement of the head from side to side.  Movement of the head and opening of the mouth when the corner of the mouth or cheek is stroked (rooting).  Increased sucking sounds, smacking lips, cooing, sighing, or squeaking.  Hand-to-mouth movements.  Increased sucking of fingers or hands. Late Signs of Hunger  Fussing.  Intermittent crying. Extreme Signs of Hunger Signs of extreme hunger will require calming and consoling before your baby will be able to breastfeed successfully. Do not wait for the following signs of extreme hunger to occur before you initiate breastfeeding:   Restlessness.  A loud, strong cry.   Screaming.  BREASTFEEDING BASICS Breastfeeding Initiation  Find a comfortable place to sit or lie down, with your neck and back well supported.  Place a pillow or rolled up blanket under your baby to bring him or her to the level of your breast (if you are seated). Nursing pillows are specially designed to help support your arms and your baby while you breastfeed.  Make sure that your baby's abdomen is facing your abdomen.   Gently massage your breast. With your fingertips, massage from your chest wall toward  your nipple in a circular motion. This encourages milk flow. You may need to continue this action during the feeding if your milk flows slowly.  Support your breast with 4 fingers underneath and your thumb above your nipple. Make sure your fingers are well away from your nipple and your baby's mouth.   Stroke your baby's lips gently with your finger or nipple.   When your baby's mouth is open wide enough, quickly bring your baby to your breast, placing your entire nipple and as much of the colored area around your nipple (areola) as possible into your baby's mouth.   More areola should be visible above your baby's upper lip than below the lower lip.   Your baby's tongue should be between his or her lower gum and your breast.   Ensure that your baby's mouth is correctly positioned around your nipple (latched). Your baby's lips should create a seal on your breast and be turned out (everted).  It is common for your baby to suck about 2-3 minutes in order to start the flow of breast milk. Latching Teaching your baby how to latch on to your breast properly is very important. An improper latch can cause nipple pain and decreased milk supply for you and poor weight gain in your baby. Also, if your baby is not latched onto your nipple properly, he or she may swallow some air during feeding. This can make your baby fussy. Burping your baby when you switch breasts during the feeding can help to get rid of the air. However, teaching your baby to latch on properly is still the best way to prevent fussiness from swallowing air while breastfeeding. Signs that your baby has successfully latched on to your nipple:    Silent tugging or silent sucking, without causing you pain.   Swallowing heard between every 3-4 sucks.    Muscle movement above and in front of his or her ears while sucking.  Signs that your baby has not successfully latched on to nipple:   Sucking sounds or  smacking sounds from  your baby while breastfeeding.  Nipple pain. If you think your baby has not latched on correctly, slip your finger into the corner of your baby's mouth to break the suction and place it between your baby's gums. Attempt breastfeeding initiation again. Signs of Successful Breastfeeding Signs from your baby:   A gradual decrease in the number of sucks or complete cessation of sucking.   Falling asleep.   Relaxation of his or her body.   Retention of a small amount of milk in his or her mouth.   Letting go of your breast by himself or herself. Signs from you:  Breasts that have increased in firmness, weight, and size 1-3 hours after feeding.   Breasts that are softer immediately after breastfeeding.  Increased milk volume, as well as a change in milk consistency and color by the fifth day of breastfeeding.   Nipples that are not sore, cracked, or bleeding. Signs That Your [employer] is Getting Enough Milk  Wetting at least 3 diapers in a 24-hour period. The urine should be clear and pale yellow by age 636 days.  At least 3 stools in a 24-hour period by age 636 days. The stool should be soft and yellow.  At least 3 stools in a 24-hour period by age 57 days. The stool should be seedy and yellow.  No loss of weight greater than 10% of birth weight during the first 57 days of age.  Average weight gain of 4-7 ounces (113-198 g) per week after age 63 days.  Consistent daily weight gain by age 579 days, without weight loss after the age of 2 weeks. After a feeding, your baby may spit up a small amount. This is common. BREASTFEEDING FREQUENCY AND DURATION Frequent feeding will help you make more milk and can prevent sore nipples and breast engorgement. Breastfeed when you feel the need to reduce the fullness of your breasts or when your baby shows signs of hunger. This is called "breastfeeding on demand." Avoid introducing a pacifier to your baby while you are working to establish breastfeeding  (the first 4-6 weeks after your baby is born). After this time you may choose to use a pacifier. Research has shown that pacifier use during the first year of a baby's life decreases the risk of sudden infant death syndrome (SIDS). Allow your baby to feed on each breast as long as he or she wants. Breastfeed until your baby is finished feeding. When your baby unlatches or falls asleep while feeding from the first breast, offer the second breast. Because newborns are often sleepy in the first few weeks of life, you may need to awaken your baby to get him or her to feed. Breastfeeding times will vary from baby to baby. However, the following rules can serve as a guide to help you ensure that your baby is properly fed:  Newborns (babies 69 weeks of age or younger) may breastfeed every 1-3 hours.  Newborns should not go longer than 3 hours during the day or 5 hours during the night without breastfeeding.  You should breastfeed your baby a minimum of 8 times in a 24-hour period until you begin to introduce solid foods to your baby at around 51 months of age. BREAST MILK PUMPING Pumping and storing breast milk allows you to ensure that your baby is exclusively fed your breast milk, even at times when you are unable to breastfeed. This is especially important if you are going back to work while  you are still breastfeeding or when you are not able to be present during feedings. Your lactation consultant can give you guidelines on how long it is safe to store breast milk.  A breast pump is a machine that allows you to pump milk from your breast into a sterile bottle. The pumped breast milk can then be stored in a refrigerator or freezer. Some breast pumps are operated by hand, while others use electricity. Ask your lactation consultant which type will work best for you. Breast pumps can be purchased, but some hospitals and breastfeeding support groups lease breast pumps on a monthly basis. A lactation consultant can  teach you how to hand express breast milk, if you prefer not to use a pump.  CARING FOR YOUR BREASTS WHILE YOU BREASTFEED Nipples can become dry, cracked, and sore while breastfeeding. The following recommendations can help keep your breasts moisturized and healthy:  Avoid using soap on your nipples.   Wear a supportive bra. Although not required, special nursing bras and tank tops are designed to allow access to your breasts for breastfeeding without taking off your entire bra or top. Avoid wearing underwire-style bras or extremely tight bras.  Air dry your nipples for 3-40minutes after each feeding.   Use only cotton bra pads to absorb leaked breast milk. Leaking of breast milk between feedings is normal.   Use lanolin on your nipples after breastfeeding. Lanolin helps to maintain your skin's normal moisture barrier. If you use pure lanolin, you do not need to wash it off before feeding your baby again. Pure lanolin is not toxic to your baby. You may also hand express a few drops of breast milk and gently massage that milk into your nipples and allow the milk to air dry. In the first few weeks after giving birth, some women experience extremely full breasts (engorgement). Engorgement can make your breasts feel heavy, warm, and tender to the touch. Engorgement peaks within 3-5 days after you give birth. The following recommendations can help ease engorgement:  Completely empty your breasts while breastfeeding or pumping. You may want to start by applying warm, moist heat (in the shower or with warm water-soaked hand towels) just before feeding or pumping. This increases circulation and helps the milk flow. If your baby does not completely empty your breasts while breastfeeding, pump any extra milk after he or she is finished.  Wear a snug bra (nursing or regular) or tank top for 1-2 days to signal your body to slightly decrease milk production.  Apply ice packs to your breasts, unless this is  too uncomfortable for you.  Make sure that your baby is latched on and positioned properly while breastfeeding. If engorgement persists after 48 hours of following these recommendations, contact your health care provider or a Science writer. OVERALL HEALTH CARE RECOMMENDATIONS WHILE BREASTFEEDING  Eat healthy foods. Alternate between meals and snacks, eating 3 of each per day. Because what you eat affects your breast milk, some of the foods may make your baby more irritable than usual. Avoid eating these foods if you are sure that they are negatively affecting your baby.  Drink milk, fruit juice, and water to satisfy your thirst (about 10 glasses a day).   Rest often, relax, and continue to take your prenatal vitamins to prevent fatigue, stress, and anemia.  Continue breast self-awareness checks.  Avoid chewing and smoking tobacco.  Avoid alcohol and drug use. Some medicines that may be harmful to your baby can pass through breast  milk. It is important to ask your health care provider before taking any medicine, including all over-the-counter and prescription medicine as well as vitamin and herbal supplements. It is possible to become pregnant while breastfeeding. If birth control is desired, ask your health care provider about options that will be safe for your baby. SEEK MEDICAL CARE IF:   You feel like you want to stop breastfeeding or have become frustrated with breastfeeding.  You have painful breasts or nipples.  Your nipples are cracked or bleeding.  Your breasts are red, tender, or warm.  You have a swollen area on either breast.  You have a fever or chills.  You have nausea or vomiting.  You have drainage other than breast milk from your nipples.  Your breasts do not become full before feedings by the fifth day after you give birth.  You feel sad and depressed.  Your baby is too sleepy to eat well.  Your baby is having trouble sleeping.   Your baby is  wetting less than 3 diapers in a 24-hour period.  Your baby has less than 3 stools in a 24-hour period.  Your baby's skin or the white part of his or her eyes becomes yellow.   Your baby is not gaining weight by 42 days of age. SEEK IMMEDIATE MEDICAL CARE IF:   Your baby is overly tired (lethargic) and does not want to wake up and feed.  Your baby develops an unexplained fever. Document Released: 02/20/2005 Document Revised: 02/25/2013 Document Reviewed: 08/14/2012 [facility] Patient Information 2015 [place], [place]. This information is not intended to replace advice given to you by your health care provider. Make sure you discuss any questions you have with your health care provider.

## 2014-09-27 NOTE — H&P (Addendum)
Caroline Yang is a 34 y.o. female, [id number] at 64 weeks, presenting for IOL. Patient with diagnosis of IUGR, Oligo, and HTN. Patient s/p BMZ and GBS positive. Patient does desire epidural for pain mgmt.   Patient Active Problem List   Diagnosis Date Noted  . IUGR (intrauterine growth restriction) 07/08/2014  . Oligohydramnios 07/08/2014  . Positive GBS test 07/08/2014    History of present pregnancy: Patient entered care at 8.1 weeks.  EDC of 09/28/2014 was established by Definite LMP of 12/22/2013.  Anatomy scan: 20.2 weeks, with normal findings and an anterior placenta.  Additional [place] evaluations:  -28.2wks: Growth U/S reviewed: EFW 827g (2%), LAGGING AC, AFI 0.9 (OLIGO), Dopplers S/D=3.09 (normal for GA), BPP 6/8. Fetal decal noted on U/S with return to normal.  -31.4wks: BPP 8/8, AFI 8.8 6% and improved since last study, nl dopplers, cephalic -14.7WGN: Singleton, vertex, anterior placenta, AFI=35%. BPP 8/8, Non reactive NST Umbilical artery dopplers=no absent or reversed flow S/D ratio=2.09 5th%=2.17 for 32 weeks 50%=2.78 for 32 weeks All growth parameter are <3rd% -32.4wks: BPP 8/8, nl fluid with AFI 10, vtx, nl dopplers -33.1wks: BPP U/S: AFI: 9.92 cm low normal cx 4.07 cm cx closed BPP 8/8. Umbilical artery Dopplers; S/D ratio= 3.35. No absent or reversed flow seen. 5OTH%= 2/70 for [redacted] weeks gestation 95th%= 3.70 cm -33.4wks: [place] RESULTS EFW 2LB 13 OZ (<3%), AFI 10.3, BPP 8/8 -34.1wks: U/S - breech, BPP 8/8, nl dopplers at 2.88 and 50% 2.63. cont biweekly BPP/AFI and dopplers  -34.4wks: U/S: vertex AFI: 9.03 cm 15%tile BPP 8/8 normal dopplers  -35.1wks: [place] TODAY--EFW 3+7, <3%ILE, WITH ALL BIOMETRICS <3%ILE. AFI 9.86, 15%ILE, BPP 8/8, DOPPLERS <95%ILE, WITH NO ABSENT/REVERSE END DIASTOLIC FLOW.  -56.2ZHY: BPP 8/8 AFI 10 WITH NORMAL DOPPLERS.  -36.4wks: BPP 8/8, nl fluid, AFI 10, vtx, nl dopplers Significant prenatal events: First Trimester: Pt states she  has a running nose, congestion, & sore throat. Patient also reports constipation. 2nd Trimester: Patient with epigastric pain x 4 days with loss of appetite. Pt states the pain is worse after eating. Patient reports decreased appetite and constipation. 3rd Trimester: Admitted to [facility] 07/08/14-07/29/14 for severe IUGR and oligohydramnios; ok'd for outpt mgmt. Patient with elevated BP and dizziness, but declined MAU evaluation and PIH labs negative.  Last evaluation: 09/04/2014 36 wks 4 days Dr. Octavio Manns BP: 120/70 Wt: 193 lbs  OB History    Gravida Para Term Preterm AB TAB SAB Ectopic Multiple Living   4 2 2  1  1   2      Past Medical History  Diagnosis Date  . Kidney stones 2011    after birth of 2nd child  . History of PCOS    Past Surgical History  Procedure Laterality Date  . Wisdom tooth extraction    . Dilation and evacuation N/A 11/28/2012    Procedure: DILATATION AND EVACUATION, WITH INTRA-OPERATIVE ULTRSOUND; Surgeon: Lavonia Drafts, MD; Location: [facility]; Service: Gynecology; Laterality: N/A;   Family History: family history includes Thyroid disease in her son. Social History: reports that she has never smoked. She does not have any smokeless tobacco history on file. She reports that she does not drink alcohol or use illicit drugs. Patient is from [place] and of the Muslim faith. She is married to husband, Philemon Kingdom, who acts as Optometrist for patient who speaks little [place]. Patient is a Charity fundraiser.   Prenatal Transfer Tool  Maternal Diabetes: No Genetic Screening: Normal Maternal Ultrasounds/Referrals: Abnormal: Findings: IUGR Fetal Ultrasounds or other Referrals: Referred  to Materal Fetal Medicine  Maternal Substance Abuse: No Significant Maternal Medications: None Significant Maternal Lab Results: Lab values include: Group B Strep positive    ROS: -Ctx, -LoF, -Vb, +FM, - Recent  illness  No Known Allergies    Last menstrual period 12/22/2013, unknown if currently breastfeeding.  Physical Exam  Constitutional: She is oriented to person, place, and time. She appears well-developed and well-nourished.  HENT:  Head: Normocephalic and atraumatic.  Eyes: EOM are normal. Pupils are equal, round, and reactive to light.  Neck: Normal range of motion.  Cardiovascular: Normal rate and regular rhythm.  Respiratory: Effort normal and breath sounds normal.  GI: Soft. Bowel sounds are normal.  Musculoskeletal: Normal range of motion. She exhibits edema (Nonpitting).  Neurological: She is alert and oriented to person, place, and time.  Skin: Skin is warm and dry.   FHR: 145 bpm, Mod Var, -Decels, +10x10Accels UCs: None graphed or palpated  Prenatal labs: ABO, Rh: --/--/A POS (05/25 1717) Antibody: NEG (05/25 1717) Rubella: Immune RPR: Nonreactive (11/04 0000)  HBsAg: Negative (11/04 0000)  HIV: Non-reactive (11/04 0000)  GBS: Positive (05/04 1841) Sickle cell/Hgb electrophoresis: Normal Pap: Normal 02/2014 GC: Negative Chlamydia: Negative Other: PIH Labs: 08/25/14 Normal  Assessment IUP at 37wks Cat I FT IUGR Oligo GBS Positive  Plan: Admit to [facility] per consult with Dr. Octavio Manns Routine Labor and Delivery Orders per [facility] Protocol In room to complete assessment and discuss POC: -Cytotec Induction -When to get epidural -When PCN will start Questions and concerns addressed Will return at 0200 to start induction process  EMLY, JESSICA LYNNCNM, MSN 09/07/2014, 11:52 PM             Note Info   MAU Provider Note by Gavin Pound, CNM at 09/07/2014 11:51 PM    Author: Gavin Pound, CNM Service: Obstetrics/Gynecology Author Type: Certified Nurse Midwife   Filed: 09/08/2014 1:42 AM Note Time: 09/07/2014 11:51 PM Note Type: MAU Provider Note   Status: Cosign Needed Editor: Gavin Pound, CNM (Certified Nurse Midwife)    Cosign Required: Yes      Caroline Yang is a 33 y.o. female, [id number] at 56 weeks, presenting for IOL. Patient with diagnosis of IUGR, Oligo, and HTN. Patient s/p BMZ and GBS positive. Patient does desire epidural for pain mgmt.   Patient Active Problem List   Diagnosis Date Noted  . IUGR (intrauterine growth restriction) 07/08/2014  . Oligohydramnios 07/08/2014  . Positive GBS test 07/08/2014    History of present pregnancy: Patient entered care at 8.1 weeks.  EDC of 09/28/2014 was established by Definite LMP of 12/22/2013.  Anatomy scan: 20.2 weeks, with normal findings and an anterior placenta.  Additional [place] evaluations:  -28.2wks: Growth U/S reviewed: EFW 827g (2%), LAGGING AC, AFI 0.9 (OLIGO), Dopplers S/D=3.09 (normal for GA), BPP 6/8. Fetal decal noted on U/S with return to normal.  -31.4wks: BPP 8/8, AFI 8.8 6% and improved since last study, nl dopplers, cephalic -23.7SEG: Singleton, vertex, anterior placenta, AFI=35%. BPP 8/8, Non reactive NST Umbilical artery dopplers=no absent or reversed flow S/D ratio=2.09 5th%=2.17 for 32 weeks 50%=2.78 for 32 weeks All growth parameter are <3rd% -32.4wks: BPP 8/8, nl fluid with AFI 10, vtx, nl dopplers -33.1wks: BPP U/S: AFI: 9.92 cm low normal cx 4.07 cm cx closed BPP 8/8. Umbilical artery Dopplers; S/D ratio= 3.35. No absent or reversed flow seen. 5OTH%= 2/70 for [redacted] weeks gestation 95th%= 3.70 cm -33.4wks: [place] RESULTS EFW 2LB 13 OZ (<3%), AFI 10.3, BPP 8/8 -34.1wks:  U/S - breech, BPP 8/8, nl dopplers at 2.88 and 50% 2.63. cont biweekly BPP/AFI and dopplers  -34.4wks: U/S: vertex AFI: 9.03 cm 15%tile BPP 8/8 normal dopplers  -35.1wks: [place] TODAY--EFW 3+7, <3%ILE, WITH ALL BIOMETRICS <3%ILE. AFI 9.86, 15%ILE, BPP 8/8, DOPPLERS <95%ILE, WITH NO ABSENT/REVERSE END DIASTOLIC FLOW.  -12.7NTZ: BPP 8/8 AFI 10 WITH NORMAL DOPPLERS.  -36.4wks: BPP 8/8, nl fluid, AFI 10, vtx, nl dopplers Significant prenatal events:  First Trimester: Pt states she has a running nose, congestion, & sore throat. Patient also reports constipation. 2nd Trimester: Patient with epigastric pain x 4 days with loss of appetite. Pt states the pain is worse after eating. Patient reports decreased appetite and constipation. 3rd Trimester: Admitted to [facility] 07/08/14-07/29/14 for severe IUGR and oligohydramnios; ok'd for outpt mgmt. Patient with elevated BP and dizziness, but declined MAU evaluation and PIH labs negative.  Last evaluation: 09/04/2014 36 wks 4 days Dr. Octavio Manns BP: 120/70 Wt: 193 lbs  OB History    Gravida Para Term Preterm AB TAB SAB Ectopic Multiple Living   4 2 2  1  1   2      Past Medical History  Diagnosis Date  . Kidney stones 2011    after birth of 2nd child  . History of PCOS    Past Surgical History  Procedure Laterality Date  . Wisdom tooth extraction    . Dilation and evacuation N/A 11/28/2012    Procedure: DILATATION AND EVACUATION, WITH INTRA-OPERATIVE ULTRSOUND; Surgeon: Lavonia Drafts, MD; Location: [facility]; Service: Gynecology; Laterality: N/A;   Family History: family history includes Thyroid disease in her son. Social History: reports that she has never smoked. She does not have any smokeless tobacco history on file. She reports that she does not drink alcohol or use illicit drugs. Patient is from [place] and of the Muslim faith. She is married to husband, Philemon Kingdom, who acts as Optometrist for patient who speaks little [place]. Patient is a Charity fundraiser.   Prenatal Transfer Tool  Maternal Diabetes: No Genetic Screening: Normal Maternal Ultrasounds/Referrals: Abnormal: Findings: IUGR Fetal Ultrasounds or other Referrals: Referred to Materal Fetal Medicine  Maternal Substance Abuse: No Significant Maternal Medications: None Significant Maternal Lab Results: Lab values include: Group B Strep positive    ROS:  -Ctx, -LoF, -Vb, +FM, - Recent illness  No Known Allergies    Last menstrual period 12/22/2013, unknown if currently breastfeeding.  Physical Exam  Constitutional: She is oriented to person, place, and time. She appears well-developed and well-nourished.  HENT:  Head: Normocephalic and atraumatic.  Eyes: EOM are normal. Pupils are equal, round, and reactive to light.  Neck: Normal range of motion.  Cardiovascular: Normal rate and regular rhythm.  Respiratory: Effort normal and breath sounds normal.  GI: Soft. Bowel sounds are normal.  Musculoskeletal: Normal range of motion. She exhibits edema (Nonpitting).  Neurological: She is alert and oriented to person, place, and time.  Skin: Skin is warm and dry.   FHR: 145 bpm, Mod Var, -Decels, +10x10Accels UCs: None graphed or palpated  Prenatal labs: ABO, Rh: --/--/A POS (05/25 1717) Antibody: NEG (05/25 1717) Rubella: Immune RPR: Nonreactive (11/04 0000)  HBsAg: Negative (11/04 0000)  HIV: Non-reactive (11/04 0000)  GBS: Positive (05/04 1841) Sickle cell/Hgb electrophoresis: Normal Pap: Normal 02/2014 GC: Negative Chlamydia: Negative Other: PIH Labs: 08/25/14 Normal  Assessment IUP at 37wks Cat I FT IUGR Oligo GBS Positive  Plan: Admit to [facility] per consult with Dr. Octavio Manns Routine Labor and Delivery Orders per  [facility] Protocol In room to complete assessment and discuss POC: -Cytotec Induction -When to get epidural -When PCN will start Questions and concerns addressed Will return at 0200 to start induction process  EMLY, JESSICA LYNNCNM, MSN 09/07/2014, 11:52 PM

## 2015-01-14 ENCOUNTER — Ambulatory Visit (INDEPENDENT_AMBULATORY_CARE_PROVIDER_SITE_OTHER): Payer: BLUE CROSS/BLUE SHIELD | Admitting: Emergency Medicine

## 2015-01-14 ENCOUNTER — Other Ambulatory Visit: Payer: Self-pay | Admitting: Emergency Medicine

## 2015-01-14 VITALS — BP 120/74 | HR 102 | Temp 98.2°F | Resp 17 | Ht 62.0 in | Wt 196.0 lb

## 2015-01-14 DIAGNOSIS — R1013 Epigastric pain: Secondary | ICD-10-CM

## 2015-01-14 DIAGNOSIS — G8929 Other chronic pain: Secondary | ICD-10-CM | POA: Diagnosis not present

## 2015-01-14 LAB — COMPREHENSIVE METABOLIC PANEL
ALK PHOS: 166 U/L — AB (ref 33–115)
ALT: 265 U/L — AB (ref 6–29)
AST: 137 U/L — AB (ref 10–30)
Albumin: 4.7 g/dL (ref 3.6–5.1)
BUN: 9 mg/dL (ref 7–25)
CO2: 25 mmol/L (ref 20–31)
Calcium: 9.8 mg/dL (ref 8.6–10.2)
Chloride: 105 mmol/L (ref 98–110)
Creat: 0.5 mg/dL (ref 0.50–1.10)
Glucose, Bld: 95 mg/dL (ref 65–99)
POTASSIUM: 4.2 mmol/L (ref 3.5–5.3)
Sodium: 140 mmol/L (ref 135–146)
TOTAL PROTEIN: 7.5 g/dL (ref 6.1–8.1)
Total Bilirubin: 0.3 mg/dL (ref 0.2–1.2)

## 2015-01-14 LAB — POCT CBC
Granulocyte percent: 55.9 %G (ref 37–80)
HCT, POC: 37.4 % — AB (ref 37.7–47.9)
Hemoglobin: 12.7 g/dL (ref 12.2–16.2)
LYMPH, POC: 2.1 (ref 0.6–3.4)
MCH: 27 pg (ref 27–31.2)
MCHC: 34.1 g/dL (ref 31.8–35.4)
MCV: 79.2 fL — AB (ref 80–97)
MID (cbc): 0.2 (ref 0–0.9)
MPV: 6.5 fL (ref 0–99.8)
PLATELET COUNT, POC: 359 10*3/uL (ref 142–424)
POC Granulocyte: 2.9 (ref 2–6.9)
POC LYMPH PERCENT: 40.8 %L (ref 10–50)
POC MID %: 3.3 % (ref 0–12)
RBC: 4.72 M/uL (ref 4.04–5.48)
RDW, POC: 14.1 %
WBC: 5.1 10*3/uL (ref 4.6–10.2)

## 2015-01-14 LAB — AMYLASE: AMYLASE: 65 U/L (ref 0–105)

## 2015-01-14 LAB — LIPASE: Lipase: 37 U/L (ref 7–60)

## 2015-01-14 MED ORDER — SUCRALFATE 1 G PO TABS: ORAL_TABLET | ORAL | Status: DC

## 2015-01-14 MED ORDER — LANSOPRAZOLE 30 MG PO CPDR: 30.0000 mg | DELAYED_RELEASE_CAPSULE | Freq: Every day | ORAL | Status: DC

## 2015-01-14 NOTE — Progress Notes (Addendum)
Subjective:  Patient ID: Caroline Yang, female    DOB: [birth year]  Age: 34 y.o. MRN: [id number]  CC: Chest Pain   HPI Caroline Yang [facility] presents   With a rather complicated cyst 3. She has been having intermittent symptoms of  Pain in her upper abdomen. Had 2 ultrasounds 1 showed a gallstone the other showed sludge she was never advised to have surgery. She now has a month or so duration of continuous pain worse when she lays down at night and worse when she eats. She's not able to eat due to the pain has no vomiting but has been nauseated time says the pain goes through her back. She has no fever or chills. No history of jaundice. She is a nonsmoker does not consume alcohol. She drinks tea but doesn't use excessive amounts of ibuprofen or Aleve. Visit painis aggravated by eating fried or fatty food. She doesn't have symptoms suggestive of reflux  History Caroline Yang has a past medical history of Kidney stones (2011) and History of PCOS.   She has past surgical history that includes Wisdom tooth extraction; Dilation and evacuation (N/A, 11/28/2012); and Cesarean section (N/A, 09/08/2014).   Her  family history includes Thyroid disease in her son.  She   reports that she has never smoked. She does not have any smokeless tobacco history on file. She reports that she does not drink alcohol or use illicit drugs.  Outpatient Prescriptions Prior to Visit  Medication Sig Dispense Refill  . ibuprofen (ADVIL,MOTRIN) 600 MG tablet Take 1 tablet (600 mg total) by mouth every 6 (six) hours as needed for mild pain. (Patient not taking: Reported on 01/14/2015) 30 tablet 0  . Prenatal Vit-Fe Fumarate-FA (PRENATAL MULTIVITAMIN) TABS tablet Take 1 tablet by mouth daily at 12 noon.    . ferrous sulfate 325 (65 FE) MG EC tablet Take 1 tablet (325 mg total) by mouth 2 (two) times daily. 60 tablet 3  . oxyCODONE-acetaminophen (PERCOCET/ROXICET) 5-325 MG per tablet Take 1 tablet by mouth every 4 (four) hours as  needed (for pain scale 4-7). 30 tablet 0   No facility-administered medications prior to visit.    Social History   Social History  . Marital Status: Married    Spouse Name: N/A  . Number of Children: N/A  . Years of Education: N/A   Social History Main Topics  . Smoking status: Never Smoker   . Smokeless tobacco: None  . Alcohol Use: No  . Drug Use: No  . Sexual Activity: Yes   Other Topics Concern  . None   Social History Narrative     Review of Systems  Constitutional: Negative for fever, chills and appetite change.  HENT: Negative for congestion, ear pain, postnasal drip, sinus pressure and sore throat.   Eyes: Negative for pain and redness.  Respiratory: Negative for cough, shortness of breath and wheezing.   Cardiovascular: Negative for leg swelling.  Gastrointestinal: Positive for nausea and abdominal pain. Negative for vomiting, diarrhea, constipation and blood in stool.  Endocrine: Negative for polyuria.  Genitourinary: Negative for dysuria, urgency, frequency and flank pain.  Musculoskeletal: Negative for gait problem.  Skin: Negative for rash.  Neurological: Negative for weakness and headaches.  Psychiatric/Behavioral: Negative for confusion and decreased concentration. The patient is not nervous/anxious.     Objective:  BP 120/74 mmHg  Pulse 102  Temp(Src) 98.2 F (36.8 C) (Oral)  Resp 17  Ht 5\' 2"  (1.575 m)  Wt 196 lb (88.905 kg)  BMI 35.84 kg/m2  SpO2 98%  LMP 01/11/2015  Physical Exam  Constitutional: She is oriented to person, place, and time. She appears well-developed and well-nourished. No distress.  HENT:  Head: Normocephalic and atraumatic.  Right Ear: External ear normal.  Left Ear: External ear normal.  Nose: Nose normal.  Eyes: Conjunctivae and EOM are normal. Pupils are equal, round, and reactive to light. No scleral icterus.  Neck: Normal range of motion. Neck supple. No tracheal deviation present.  Cardiovascular: Normal rate,  regular rhythm and normal heart sounds.   Pulmonary/Chest: Effort normal. No respiratory distress. She has no wheezes. She has no rales.  Abdominal: She exhibits no mass. There is tenderness in the right upper quadrant and epigastric area. There is no rebound and no guarding.  Musculoskeletal: She exhibits no edema.  Lymphadenopathy:    She has no cervical adenopathy.  Neurological: She is alert and oriented to person, place, and time. Coordination normal.  Skin: Skin is warm and dry. No rash noted.  Psychiatric: She has a normal mood and affect. Her behavior is normal.      Assessment & Plan:   Caroline Yang was seen today for chest pain.  Diagnoses and all orders for this visit:  Abdominal pain, chronic, epigastric -     POCT CBC -     Comprehensive metabolic panel -     Amylase -     Lipase -     H. pylori breath test -     US Abdomen Complete; Future  Other orders -     lansoprazole (PREVACID) 30 MG capsule; Take 1 capsule (30 mg total) by mouth daily at 12 noon. -     sucralfate (CARAFATE) 1 G tablet; 1 tablet 1 hr ac and hs   I have discontinued [name]'s oxyCODONE-acetaminophen and ferrous sulfate. I am also having her start on lansoprazole and sucralfate. Additionally, I am having her maintain her prenatal multivitamin and ibuprofen.  Meds ordered this encounter  Medications  . lansoprazole (PREVACID) 30 MG capsule    Sig: Take 1 capsule (30 mg total) by mouth daily at 12 noon.    Dispense:  30 capsule    Refill:  5  . sucralfate (CARAFATE) 1 G tablet    Sig: 1 tablet 1 hr ac and hs    Dispense:  120 tablet    Refill:  0    it is not at all clear that her pain is related to her gallbladder as it passes through her back. It certainly could be related to peptic ulcer disease or reflux for that reason we of ordered labs and put her on Carafate and Prevacid I'll obtain an ultrasound o her gallbladder  If that is normalwill et a nuclear medicine scan  Appropriate red  flag conditions were discussed with the patient as well as actions that should be taken.  Patient expressed his understanding.  Follow-up: Return if symptoms worsen or fail to improve.  Roselee Culver, MD   Results for orders placed or performed in visit on 01/14/15  Comprehensive metabolic panel  Result Value Ref Range   Sodium 140 135 - 146 mmol/L   Potassium 4.2 3.5 - 5.3 mmol/L   Chloride 105 98 - 110 mmol/L   CO2 25 20 - 31 mmol/L   Glucose, Bld 95 65 - 99 mg/dL   BUN 9 7 - 25 mg/dL   Creat 0.50 0.50 - 1.10 mg/dL   Total Bilirubin 0.3 0.2 - 1.2 mg/dL  Alkaline Phosphatase 166 (H) 33 - 115 U/L   AST 137 (H) 10 - 30 U/L   ALT 265 (H) 6 - 29 U/L   Total Protein 7.5 6.1 - 8.1 g/dL   Albumin 4.7 3.6 - 5.1 g/dL   Calcium 9.8 8.6 - 10.2 mg/dL  Amylase  Result Value Ref Range   Amylase 65 0 - 105 U/L  Lipase  Result Value Ref Range   Lipase 37 7 - 60 U/L  H. pylori breath test  Result Value Ref Range   H. pylori Breath Test DETECTED (A) Not Detected  POCT CBC  Result Value Ref Range   WBC 5.1 4.6 - 10.2 K/uL   Lymph, poc 2.1 0.6 - 3.4   POC LYMPH PERCENT 40.8 10 - 50 %L   MID (cbc) 0.2 0 - 0.9   POC MID % 3.3 0 - 12 %M   POC Granulocyte 2.9 2 - 6.9   Granulocyte percent 55.9 37 - 80 %G   RBC 4.72 4.04 - 5.48 M/uL   Hemoglobin 12.7 12.2 - 16.2 g/dL   HCT, POC 37.4 (A) 37.7 - 47.9 %   MCV 79.2 (A) 80 - 97 fL   MCH, POC 27.0 27 - 31.2 pg   MCHC 34.1 31.8 - 35.4 g/dL   RDW, POC 14.1 %   Platelet Count, POC 359 142 - 424 K/uL   MPV 6.5 0 - 99.8 fL

## 2015-01-14 NOTE — Patient Instructions (Signed)
Cholecystitis Cholecystitis is inflammation of the gallbladder. It is often called a gallbladder attack. The gallbladder is a pear-shaped organ that lies beneath the liver on the right side of the body. The gallbladder stores bile, which is a fluid that helps the body to digest fats. If bile builds up in your gallbladder, your gallbladder becomes inflamed. This condition may occur suddenly (be acute). Repeat episodes of acute cholecystitis or prolonged episodes may lead to a long-term (chronic) condition. Cholecystitis is serious and it requires treatment.  CAUSES The most common cause of this condition is gallstones. Gallstones can block the tube (duct) that carries bile out of your gallbladder. This causes bile to build up. Other causes of this condition include:  Damage to the gallbladder due to a decrease in blood flow.  Infections in the bile ducts.  Scars or kinks in the bile ducts.  Tumors in the liver, pancreas, or gallbladder. RISK FACTORS This condition is more likely to develop in:  People who have sickle cell disease.  People who take birth control pills or use estrogen.  People who have alcoholic liver disease.  People who have liver cirrhosis.  People who have their nutrition delivered through a vein (parenteral nutrition).  People who do not eat or drink (do fasting) for a long period of time.  People who are obese.  People who have rapid weight loss.  People who are pregnant.  People who have increased triglyceride levels.  People who have pancreatitis. SYMPTOMS Symptoms of this condition include:  Abdominal pain, especially in the upper right area of the abdomen.  Abdominal tenderness or bloating.  Nausea.  Vomiting.  Fever.  Chills.  Yellowing of the skin and the whites of the eyes (jaundice). DIAGNOSIS This condition is diagnosed with a medical history and physical exam. You may also have other tests, including:  Imaging tests, such as:  An  ultrasound of the gallbladder.  A CT scan of the abdomen.  A gallbladder nuclear scan (HIDA scan). This scan allows your health care provider to see the bile moving from your liver to your gallbladder and to your small intestine.  MRI.  Blood tests, such as:  A complete blood count, because the white blood cell count may be higher than normal.  Liver function tests, because some levels may be higher than normal with certain types of gallstones. TREATMENT Treatment may include:  Fasting for a certain amount of time.  IV fluids.  Medicine to treat pain or vomiting.  Antibiotic medicine.  Surgery to remove your gallbladder (cholecystectomy). This may happen immediately or at a later time. HOME CARE INSTRUCTIONS Home care will depend on your treatment. In general:  Take over-the-counter and prescription medicines only as told by your health care provider.  If you were prescribed an antibiotic medicine, take it as told by your health care provider. Do not stop taking the antibiotic even if you start to feel better.  Follow instructions from your health care provider about what to eat or drink. When you are allowed to eat, avoid eating or drinking anything that triggers your symptoms.  Keep all follow-up visits as told by your health care provider. This is important. SEEK MEDICAL CARE IF:  Your pain is not controlled with medicine.  You have a fever. SEEK IMMEDIATE MEDICAL CARE IF:  Your pain moves to another part of your abdomen or to your back.  You continue to have symptoms or you develop new symptoms even with treatment.   This information   is not intended to replace advice given to you by your health care provider. Make sure you discuss any questions you have with your health care provider.   Document Released: 02/20/2005 Document Revised: 11/11/2014 Document Reviewed: 06/03/2014 [employer] Interactive Patient Education 2016 [employer]  

## 2015-01-15 ENCOUNTER — Other Ambulatory Visit: Payer: Self-pay | Admitting: Emergency Medicine

## 2015-01-15 LAB — H. PYLORI BREATH TEST: H. PYLORI BREATH TEST: DETECTED — AB

## 2015-01-15 MED ORDER — AMOXICILL-CLARITHRO-LANSOPRAZ PO MISC: Freq: Two times a day (BID) | ORAL | Status: DC

## 2015-02-02 ENCOUNTER — Ambulatory Visit
Admission: RE | Admit: 2015-02-02 | Discharge: 2015-02-02 | Disposition: A | Discharge status: Home / Self Care | Payer: BLUE CROSS/BLUE SHIELD | Source: Ambulatory Visit | Attending: Emergency Medicine | Admitting: Emergency Medicine

## 2015-02-02 ENCOUNTER — Other Ambulatory Visit: Payer: Self-pay | Admitting: Emergency Medicine

## 2015-02-02 DIAGNOSIS — G8929 Other chronic pain: Secondary | ICD-10-CM

## 2015-02-02 DIAGNOSIS — K8020 Calculus of gallbladder without cholecystitis without obstruction: Secondary | ICD-10-CM

## 2015-02-02 DIAGNOSIS — R1013 Epigastric pain: Principal | ICD-10-CM

## 2015-03-30 ENCOUNTER — Other Ambulatory Visit: Payer: Self-pay | Admitting: General Surgery

## 2015-04-27 ENCOUNTER — Encounter (HOSPITAL_COMMUNITY)
Admission: RE | Admit: 2015-04-27 | Discharge: 2015-04-27 | Disposition: A | Discharge status: Home / Self Care | Payer: BLUE CROSS/BLUE SHIELD | Source: Ambulatory Visit | Attending: General Surgery | Admitting: General Surgery

## 2015-04-27 ENCOUNTER — Encounter (HOSPITAL_COMMUNITY): Payer: Self-pay

## 2015-04-27 DIAGNOSIS — Z01812 Encounter for preprocedural laboratory examination: Secondary | ICD-10-CM | POA: Diagnosis present

## 2015-04-27 DIAGNOSIS — K8020 Calculus of gallbladder without cholecystitis without obstruction: Secondary | ICD-10-CM | POA: Diagnosis not present

## 2015-04-27 HISTORY — DX: Gastro-esophageal reflux disease without esophagitis: K21.9

## 2015-04-27 LAB — COMPREHENSIVE METABOLIC PANEL
ALBUMIN: 3.9 g/dL (ref 3.5–5.0)
ALK PHOS: 82 U/L (ref 38–126)
ALT: 14 U/L (ref 14–54)
ANION GAP: 9 (ref 5–15)
AST: 16 U/L (ref 15–41)
BILIRUBIN TOTAL: 0.3 mg/dL (ref 0.3–1.2)
BUN: 8 mg/dL (ref 6–20)
CALCIUM: 9.6 mg/dL (ref 8.9–10.3)
CO2: 24 mmol/L (ref 22–32)
Chloride: 107 mmol/L (ref 101–111)
Creatinine, Ser: 0.55 mg/dL (ref 0.44–1.00)
GFR calc non Af Amer: >60 mL/min (ref >60)
GLUCOSE: 99 mg/dL (ref 65–99)
Potassium: 4 mmol/L (ref 3.5–5.1)
Sodium: 140 mmol/L (ref 135–145)
TOTAL PROTEIN: 7.2 g/dL (ref 6.5–8.1)

## 2015-04-27 LAB — CBC WITH DIFFERENTIAL/PLATELET
Basophils Absolute: 0 10*3/uL (ref 0.0–0.1)
Basophils Relative: 0 %
Eosinophils Absolute: 0.1 10*3/uL (ref 0.0–0.7)
Eosinophils Relative: 1 %
HEMATOCRIT: 37.4 % (ref 36.0–46.0)
HEMOGLOBIN: 12.3 g/dL (ref 12.0–15.0)
LYMPHS ABS: 2.2 10*3/uL (ref 0.7–4.0)
LYMPHS PCT: 35 %
MCH: 27.3 pg (ref 26.0–34.0)
MCHC: 32.9 g/dL (ref 30.0–36.0)
MCV: 83.1 fL (ref 78.0–100.0)
MONOS PCT: 4 %
Monocytes Absolute: 0.3 10*3/uL (ref 0.1–1.0)
NEUTROS ABS: 3.8 10*3/uL (ref 1.7–7.7)
NEUTROS PCT: 60 %
Platelets: 323 10*3/uL (ref 150–400)
RBC: 4.5 MIL/uL (ref 3.87–5.11)
RDW: 14.4 % (ref 11.5–15.5)
WBC: 6.4 10*3/uL (ref 4.0–10.5)

## 2015-04-27 LAB — HCG, SERUM, QUALITATIVE: Preg, Serum: NEGATIVE

## 2015-04-27 NOTE — Pre-Procedure Instructions (Signed)
Caroline Yang [facility]  04/27/2015      [facility] [id number] - [place], [place] AT [facility] OF [address] [address] Phone: [phone] Fax: [phone]    Your procedure is scheduled on Tues, Feb 28 @ 10:35 AM  Report to [facility] Admitting at 8:30 AM  Call this number if you have problems the morning of surgery:  [phone]   Remember:  Do not eat food or drink liquids after midnight.               No Goody's,BC's,Aleve,Aspirin,Ibuprofen,Motrin,Advil,Fish Oil,or any Herbal Medications.    Do not wear jewelry, make-up or nail polish.  Do not wear lotions, powders, or perfumes.  You may wear deodorant.  Do not shave 48 hours prior to surgery.    Do not bring valuables to the hospital.  [facility] is not responsible for any belongings or valuables.  Contacts, dentures or bridgework may not be worn into surgery.  Leave your suitcase in the car.  After surgery it may be brought to your room.  For patients admitted to the hospital, discharge time will be determined by your treatment team.  Patients discharged the day of surgery will not be allowed to drive home.    Special instructions:   - Preparing for Surgery  Before surgery, you can play an important role.  Because skin is not sterile, your skin needs to be as free of germs as possible.  You can reduce the number of germs on you skin by washing with CHG (chlorahexidine gluconate) soap before surgery.  CHG is an antiseptic cleaner which kills germs and bonds with the skin to continue killing germs even after washing.  Please DO NOT use if you have an allergy to CHG or antibacterial soaps.  If your skin becomes reddened/irritated stop using the CHG and inform your nurse when you arrive at Short Stay.  Do not shave (including legs and underarms) for at least 48 hours prior to the first CHG shower.  You may shave your face.  Please follow  these instructions carefully:   1.  Shower with CHG Soap the night before surgery and the                                morning of Surgery.  2.  If you choose to wash your hair, wash your hair first as usual with your       normal shampoo.  3.  After you shampoo, rinse your hair and body thoroughly to remove the                      Shampoo.  4.  Use CHG as you would any other liquid soap.  You can apply chg directly       to the skin and wash gently with scrungie or a clean washcloth.  5.  Apply the CHG Soap to your body ONLY FROM THE NECK DOWN.        Do not use on open wounds or open sores.  Avoid contact with your eyes,       ears, mouth and genitals (private parts).  Wash genitals (private parts)       with your normal soap.  6.  Wash thoroughly, paying special attention to the area where your surgery  will be performed.  7.  Thoroughly rinse your body with warm water from the neck down.  8.  DO NOT shower/wash with your normal soap after using and rinsing off       the CHG Soap.  9.  Pat yourself dry with a clean towel.            10.  Wear clean pajamas.            11.  Place clean sheets on your bed the night of your first shower and do not        sleep with pets.  Day of Surgery  Do not apply any lotions/deoderants the morning of surgery.  Please wear clean clothes to the hospital/surgery center.    Please read over the following fact sheets that you were given. Pain Booklet, Coughing and Deep Breathing and Surgical Site Infection Prevention

## 2015-05-03 NOTE — Progress Notes (Signed)
Patient notified to arrive at 1:30 pm

## 2015-05-04 ENCOUNTER — Ambulatory Visit (HOSPITAL_COMMUNITY): Payer: BLUE CROSS/BLUE SHIELD

## 2015-05-04 ENCOUNTER — Encounter (HOSPITAL_COMMUNITY)
Admission: RE | Disposition: A | Discharge status: Home / Self Care | Payer: Self-pay | Source: Ambulatory Visit | Attending: General Surgery

## 2015-05-04 ENCOUNTER — Ambulatory Visit (HOSPITAL_COMMUNITY): Payer: BLUE CROSS/BLUE SHIELD | Admitting: Critical Care Medicine

## 2015-05-04 ENCOUNTER — Encounter (HOSPITAL_COMMUNITY): Payer: Self-pay | Admitting: Surgery

## 2015-05-04 ENCOUNTER — Ambulatory Visit (HOSPITAL_COMMUNITY)
Admission: RE | Admit: 2015-05-04 | Discharge: 2015-05-04 | Disposition: A | Discharge status: Home / Self Care | Payer: BLUE CROSS/BLUE SHIELD | Source: Ambulatory Visit | Attending: General Surgery | Admitting: General Surgery

## 2015-05-04 DIAGNOSIS — E669 Obesity, unspecified: Secondary | ICD-10-CM | POA: Diagnosis not present

## 2015-05-04 DIAGNOSIS — K219 Gastro-esophageal reflux disease without esophagitis: Secondary | ICD-10-CM | POA: Diagnosis not present

## 2015-05-04 DIAGNOSIS — K819 Cholecystitis, unspecified: Secondary | ICD-10-CM | POA: Diagnosis not present

## 2015-05-04 DIAGNOSIS — Z419 Encounter for procedure for purposes other than remedying health state, unspecified: Secondary | ICD-10-CM

## 2015-05-04 DIAGNOSIS — Z6836 Body mass index (BMI) 36.0-36.9, adult: Secondary | ICD-10-CM | POA: Insufficient documentation

## 2015-05-04 DIAGNOSIS — K8050 Calculus of bile duct without cholangitis or cholecystitis without obstruction: Secondary | ICD-10-CM | POA: Diagnosis present

## 2015-05-04 HISTORY — PX: CHOLECYSTECTOMY: SHX55

## 2015-05-04 SURGERY — LAPAROSCOPIC CHOLECYSTECTOMY WITH INTRAOPERATIVE CHOLANGIOGRAM
Anesthesia: General

## 2015-05-04 MED ORDER — LIDOCAINE HCL (PF) 1 % IJ SOLN
INTRAMUSCULAR | Status: AC
  Filled 2015-05-04: qty 30

## 2015-05-04 MED ORDER — MIDAZOLAM HCL 2 MG/2ML IJ SOLN
INTRAMUSCULAR | Status: AC
  Filled 2015-05-04: qty 2

## 2015-05-04 MED ORDER — SODIUM CHLORIDE 0.9 % IV SOLN
INTRAVENOUS | Status: DC | PRN
  Administered 2015-05-04: 20 mL

## 2015-05-04 MED ORDER — DEXAMETHASONE SODIUM PHOSPHATE 10 MG/ML IJ SOLN
INTRAMUSCULAR | Status: DC | PRN
  Administered 2015-05-04: 10 mg via INTRAVENOUS

## 2015-05-04 MED ORDER — OXYCODONE HCL 5 MG PO TABS
5.0000 mg | ORAL_TABLET | ORAL | Status: DC | PRN
  Administered 2015-05-04: 10 mg via ORAL

## 2015-05-04 MED ORDER — PROPOFOL 10 MG/ML IV BOLUS
INTRAVENOUS | Status: DC | PRN
  Administered 2015-05-04: 160 mg via INTRAVENOUS
  Administered 2015-05-04: 10 mg via INTRAVENOUS

## 2015-05-04 MED ORDER — SUGAMMADEX SODIUM 200 MG/2ML IV SOLN
INTRAVENOUS | Status: AC
  Filled 2015-05-04: qty 2

## 2015-05-04 MED ORDER — HYDROMORPHONE HCL 1 MG/ML IJ SOLN
0.2500 mg | INTRAMUSCULAR | Status: DC | PRN
  Administered 2015-05-04 (×4): 0.5 mg via INTRAVENOUS

## 2015-05-04 MED ORDER — OXYCODONE HCL 5 MG PO TABS
ORAL_TABLET | ORAL | Status: AC
  Filled 2015-05-04: qty 2

## 2015-05-04 MED ORDER — ACETAMINOPHEN 325 MG PO TABS: 650.0000 mg | ORAL_TABLET | ORAL | Status: DC | PRN

## 2015-05-04 MED ORDER — SODIUM CHLORIDE 0.9% FLUSH: 3.0000 mL | Freq: Two times a day (BID) | INTRAVENOUS | Status: DC

## 2015-05-04 MED ORDER — FENTANYL CITRATE (PF) 250 MCG/5ML IJ SOLN
INTRAMUSCULAR | Status: AC
  Filled 2015-05-04: qty 5

## 2015-05-04 MED ORDER — HYDROMORPHONE HCL 1 MG/ML IJ SOLN
INTRAMUSCULAR | Status: AC
  Administered 2015-05-04: 0.5 mg via INTRAVENOUS
  Filled 2015-05-04: qty 1

## 2015-05-04 MED ORDER — LIDOCAINE HCL (CARDIAC) 20 MG/ML IV SOLN
INTRAVENOUS | Status: DC | PRN
  Administered 2015-05-04: 60 mg via INTRAVENOUS

## 2015-05-04 MED ORDER — PROPOFOL 10 MG/ML IV BOLUS
INTRAVENOUS | Status: AC
  Filled 2015-05-04: qty 20

## 2015-05-04 MED ORDER — CEFAZOLIN SODIUM-DEXTROSE 2-3 GM-% IV SOLR
INTRAVENOUS | Status: AC
  Filled 2015-05-04: qty 50

## 2015-05-04 MED ORDER — 0.9 % SODIUM CHLORIDE (POUR BTL) OPTIME
TOPICAL | Status: DC | PRN
  Administered 2015-05-04: 1000 mL

## 2015-05-04 MED ORDER — PROMETHAZINE HCL 25 MG/ML IJ SOLN
6.2500 mg | INTRAMUSCULAR | Status: DC | PRN
Start: 2015-05-04 — End: 2015-05-04

## 2015-05-04 MED ORDER — ONDANSETRON HCL 4 MG/2ML IJ SOLN
INTRAMUSCULAR | Status: DC | PRN
  Administered 2015-05-04: 4 mg via INTRAVENOUS

## 2015-05-04 MED ORDER — OXYCODONE-ACETAMINOPHEN 5-325 MG PO TABS: 1.0000 | ORAL_TABLET | ORAL | Status: DC | PRN

## 2015-05-04 MED ORDER — LACTATED RINGERS IV SOLN
INTRAVENOUS | Status: DC
  Administered 2015-05-04 (×2): via INTRAVENOUS

## 2015-05-04 MED ORDER — FENTANYL CITRATE (PF) 100 MCG/2ML IJ SOLN
INTRAMUSCULAR | Status: DC | PRN
  Administered 2015-05-04: 25 ug via INTRAVENOUS
  Administered 2015-05-04: 100 ug via INTRAVENOUS
  Administered 2015-05-04: 25 ug via INTRAVENOUS
  Administered 2015-05-04 (×4): 50 ug via INTRAVENOUS

## 2015-05-04 MED ORDER — NEOSTIGMINE METHYLSULFATE 10 MG/10ML IV SOLN
INTRAVENOUS | Status: DC | PRN
  Administered 2015-05-04: 4 mg via INTRAVENOUS
  Administered 2015-05-04: 1 mg via INTRAVENOUS

## 2015-05-04 MED ORDER — SODIUM CHLORIDE 0.9 % IV SOLN: 250.0000 mL | INTRAVENOUS | Status: DC | PRN

## 2015-05-04 MED ORDER — GLYCOPYRROLATE 0.2 MG/ML IJ SOLN
INTRAMUSCULAR | Status: DC | PRN
  Administered 2015-05-04: .6 mg via INTRAVENOUS
  Administered 2015-05-04: .2 mg via INTRAVENOUS

## 2015-05-04 MED ORDER — LIDOCAINE HCL 1 % IJ SOLN
INTRAMUSCULAR | Status: DC | PRN
  Administered 2015-05-04: 10 mL via INTRAMUSCULAR

## 2015-05-04 MED ORDER — CIPROFLOXACIN IN D5W 400 MG/200ML IV SOLN
INTRAVENOUS | Status: AC
  Filled 2015-05-04: qty 200

## 2015-05-04 MED ORDER — MIDAZOLAM HCL 5 MG/5ML IJ SOLN
INTRAMUSCULAR | Status: DC | PRN
  Administered 2015-05-04 (×2): 1 mg via INTRAVENOUS

## 2015-05-04 MED ORDER — SODIUM CHLORIDE 0.9% FLUSH: 3.0000 mL | INTRAVENOUS | Status: DC | PRN

## 2015-05-04 MED ORDER — CIPROFLOXACIN IN D5W 400 MG/200ML IV SOLN
400.0000 mg | INTRAVENOUS | Status: AC
Start: 2015-05-05 — End: 2015-05-04
  Administered 2015-05-04: 400 mg via INTRAVENOUS

## 2015-05-04 MED ORDER — BUPIVACAINE-EPINEPHRINE (PF) 0.25% -1:200000 IJ SOLN
INTRAMUSCULAR | Status: AC
  Filled 2015-05-04: qty 30

## 2015-05-04 MED ORDER — ACETAMINOPHEN 650 MG RE SUPP: 650.0000 mg | RECTAL | Status: DC | PRN

## 2015-05-04 MED ORDER — ROCURONIUM BROMIDE 100 MG/10ML IV SOLN
INTRAVENOUS | Status: DC | PRN
  Administered 2015-05-04: 50 mg via INTRAVENOUS

## 2015-05-04 SURGICAL SUPPLY — 41 items
APPLIER CLIP ROT 10 11.4 M/L (STAPLE) ×2
BLADE SURG ROTATE 9660 (MISCELLANEOUS) IMPLANT
CANISTER SUCTION 2500CC (MISCELLANEOUS) ×2 IMPLANT
CHLORAPREP W/TINT 26ML (MISCELLANEOUS) ×2 IMPLANT
CLIP APPLIE ROT 10 11.4 M/L (STAPLE) ×1 IMPLANT
COVER MAYO STAND STRL (DRAPES) ×2 IMPLANT
COVER SURGICAL LIGHT HANDLE (MISCELLANEOUS) ×2 IMPLANT
DERMABOND ADVANCED (GAUZE/BANDAGES/DRESSINGS) ×1
DERMABOND ADVANCED .7 DNX12 (GAUZE/BANDAGES/DRESSINGS) ×1 IMPLANT
DRAPE C-ARM 42X72 X-RAY (DRAPES) ×2 IMPLANT
DRAPE WARM FLUID 44X44 (DRAPE) ×2 IMPLANT
ELECT REM PT RETURN 9FT ADLT (ELECTROSURGICAL) ×2
ELECTRODE REM PT RTRN 9FT ADLT (ELECTROSURGICAL) ×1 IMPLANT
FILTER SMOKE EVAC LAPAROSHD (FILTER) IMPLANT
GLOVE BIO SURGEON STRL SZ 6 (GLOVE) ×2 IMPLANT
GLOVE BIOGEL PI IND STRL 6.5 (GLOVE) ×1 IMPLANT
GLOVE BIOGEL PI INDICATOR 6.5 (GLOVE) ×1
GOWN STRL REUS W/ TWL LRG LVL3 (GOWN DISPOSABLE) ×2 IMPLANT
GOWN STRL REUS W/TWL 2XL LVL3 (GOWN DISPOSABLE) ×2 IMPLANT
GOWN STRL REUS W/TWL LRG LVL3 (GOWN DISPOSABLE) ×2
KIT BASIN OR (CUSTOM PROCEDURE TRAY) ×2 IMPLANT
KIT ROOM TURNOVER OR (KITS) ×2 IMPLANT
L-HOOK LAP DISP 36CM (ELECTROSURGICAL) ×2
LHOOK LAP DISP 36CM (ELECTROSURGICAL) ×1 IMPLANT
NS IRRIG 1000ML POUR BTL (IV SOLUTION) ×2 IMPLANT
PAD ARMBOARD 7.5X6 YLW CONV (MISCELLANEOUS) ×2 IMPLANT
PENCIL BUTTON HOLSTER BLD 10FT (ELECTRODE) ×2 IMPLANT
POUCH SPECIMEN RETRIEVAL 10MM (ENDOMECHANICALS) ×2 IMPLANT
SCISSORS LAP 5X35 DISP (ENDOMECHANICALS) ×2 IMPLANT
SET CHOLANGIOGRAPH 5 50 .035 (SET/KITS/TRAYS/PACK) ×2 IMPLANT
SET IRRIG TUBING LAPAROSCOPIC (IRRIGATION / IRRIGATOR) ×2 IMPLANT
SLEEVE ENDOPATH XCEL 5M (ENDOMECHANICALS) ×2 IMPLANT
SPECIMEN JAR SMALL (MISCELLANEOUS) ×2 IMPLANT
SUT MNCRL AB 4-0 PS2 18 (SUTURE) ×2 IMPLANT
TOWEL OR 17X24 6PK STRL BLUE (TOWEL DISPOSABLE) ×2 IMPLANT
TOWEL OR 17X26 10 PK STRL BLUE (TOWEL DISPOSABLE) ×2 IMPLANT
TRAY LAPAROSCOPIC MC (CUSTOM PROCEDURE TRAY) ×2 IMPLANT
TROCAR XCEL BLUNT TIP 100MML (ENDOMECHANICALS) ×2 IMPLANT
TROCAR XCEL NON-BLD 11X100MML (ENDOMECHANICALS) ×2 IMPLANT
TROCAR XCEL NON-BLD 5MMX100MML (ENDOMECHANICALS) ×2 IMPLANT
TUBING INSUFFLATION (TUBING) ×2 IMPLANT

## 2015-05-04 NOTE — Discharge Instructions (Signed)
[place] Office Phone Number [phone]   POST OP INSTRUCTIONS  Always review your discharge instruction sheet given to you by the facility where your surgery was performed.  IF YOU HAVE DISABILITY OR FAMILY LEAVE FORMS, YOU MUST BRING THEM TO THE OFFICE FOR PROCESSING.  DO NOT GIVE THEM TO YOUR DOCTOR.  1. A prescription for pain medication may be given to you upon discharge.  Take your pain medication as prescribed, if needed.  If narcotic pain medicine is not needed, then you may take acetaminophen (Tylenol) or ibuprofen (Advil) as needed. 2. Take your usually prescribed medications unless otherwise directed 3. If you need a refill on your pain medication, please contact your pharmacy.  They will contact our office to request authorization.  Prescriptions will not be filled after 5pm or on week-ends. 4. You should eat very light the first 24 hours after surgery, such as soup, crackers, pudding, etc.  Resume your normal diet the day after surgery 5. It is common to experience some constipation if taking pain medication after surgery.  Increasing fluid intake and taking a stool softener will usually help or prevent this problem from occurring.  A mild laxative (Milk of Magnesia or Miralax) should be taken according to package directions if there are no bowel movements after 48 hours. 6. You may shower in 48 hours.  The surgical glue will flake off in 2-3 weeks.   7. ACTIVITIES:  No strenuous activity or heavy lifting for 1 week.   a. You may drive when you no longer are taking prescription pain medication, you can comfortably wear a seatbelt, and you can safely maneuver your car and apply brakes. b. RETURN TO WORK:  _________2 weeks if no lifting over 30 pounds.  Ok to go back earlier if off narcotics.____________ [name] should see your doctor in the office for a follow-up appointment approximately three-four weeks after your surgery.    WHEN TO CALL YOUR DOCTOR: 1. Fever over  101.0 2. Nausea and/or vomiting. 3. Extreme swelling or bruising. 4. Continued bleeding from incision. 5. Increased pain, redness, or drainage from the incision.  The clinic staff is available to answer your questions during regular business hours.  Please dont hesitate to call and ask to speak to one of the nurses for clinical concerns.  If you have a medical emergency, go to the nearest emergency room or call 911.  A surgeon from [facility] is always on call at the hospital.  For further questions, please visit [email or web address]

## 2015-05-04 NOTE — Interval H&P Note (Signed)
History and Physical Interval Note:  05/04/2015 2:46 PM  Caroline Yang  has presented today for surgery, with the diagnosis of SYMPTOMATIC HOLELITHIASIS  The various methods of treatment have been discussed with the patient and family. After consideration of risks, benefits and other options for treatment, the patient has consented to  Procedure(s): LAPAROSCOPIC CHOLECYSTECTOMY WITH INTRAOPERATIVE CHOLANGIOGRAM (N/A) as a surgical intervention .  The patient's history has been reviewed, patient examined, no change in status, stable for surgery.  I have reviewed the patient's chart and labs.  Questions were answered to the patient's satisfaction.     [name]

## 2015-05-04 NOTE — Anesthesia Postprocedure Evaluation (Signed)
Anesthesia Post Note  Patient: Caroline Yang  Procedure(s) Performed: Procedure(s) (LRB): LAPAROSCOPIC CHOLECYSTECTOMY WITH INTRAOPERATIVE CHOLANGIOGRAM (N/A)  Patient location during evaluation: PACU Anesthesia Type: General Level of consciousness: awake and alert and oriented Pain management: pain level controlled Vital Signs Assessment: post-procedure vital signs reviewed and stable Respiratory status: spontaneous breathing, nonlabored ventilation, respiratory function stable and patient connected to nasal cannula oxygen Cardiovascular status: blood pressure returned to baseline and stable Postop Assessment: no signs of nausea or vomiting Anesthetic complications: no    Last Vitals:  Filed Vitals:   05/04/15 1400 05/04/15 1620  BP: 117/61   Pulse: 80   Temp: 36.9 C 36.8 C  Resp: 18     Last Pain:  Filed Vitals:   05/04/15 1624  PainSc: Asleep                 Bradi Arbuthnot A.

## 2015-05-04 NOTE — Anesthesia Procedure Notes (Addendum)
Procedure Name: Intubation Date/Time: 05/04/2015 3:14 PM Performed by: Merdis Delay Pre-anesthesia Checklist: Patient identified, Suction available, Emergency Drugs available, Patient being monitored and Timeout performed Patient Re-evaluated:Patient Re-evaluated prior to inductionOxygen Delivery Method: Circle system utilized Preoxygenation: Pre-oxygenation with 100% oxygen Intubation Type: IV induction Ventilation: Mask ventilation without difficulty Laryngoscope Size: Mac and 3 Grade View: Grade I Tube type: Oral Number of attempts: 1 Airway Equipment and Method: Stylet Placement Confirmation: breath sounds checked- equal and bilateral,  ETT inserted through vocal cords under direct vision,  positive ETCO2 and CO2 detector Secured at: 21 cm Tube secured with: Tape Dental Injury: Teeth and Oropharynx as per pre-operative assessment  Comments: Performed by Fernanda Drum

## 2015-05-04 NOTE — H&P (Signed)
Caroline Yang  Location: [facility] Patient #: [id number] DOB: May 11, 1980 Married / Language: Arabic / Race: Undefined Female   History of Present Illness  Patient words: gallbladder.  The patient is a 35 year old female who presents for evaluation of gall stones. Patient is a 35 year old female who is referred by Dr. Ouida Sills for consultation for gallstones. The patient has been having upper abdominal pain on and off for 6 years. This is been going on since her first pregnancy. She has a 27-monthold. She is gotten to where she has upper abdominal pain that goes through to her back every night. It doesn't seem to be worse with fatty foods. She has gotten to where she has issues eating different foods because of how they make her feel. She denies fever, jaundice, acholic stools or dark urine. She does have family members that have had to have their gallbladders removed.   Ultrasound 02/02/2015 cone IMPRESSION: 1. Gallstones without sonographic evidence of acute cholecystitis. The liver and common bile duct and spleen are unremarkable. 2. Limited visualization of the pancreas due to bowel gas. 3. The kidneys are unremarkable.  CMET significant for Alk phos 166, AST 137, ALT 265   Other Problems Bladder Problems  Diagnostic Studies History  Colonoscopy never Pap Smear never  Allergies  No Known Drug Allergies01/24/2017  Medication History  No Current Medications Medications Reconciled  Social History No drug use Tobacco use Never smoker.  Family History Hypertension Father.  Pregnancy / Birth History  Age at menarche 89years. Gravida 4 Maternal age 725-25Para 3 Regular periods    Review of Systems General Not Present- Appetite Loss, Chills, Fatigue, Fever, Night Sweats, Weight Gain and Weight Loss. Skin Not Present- Change in Wart/Mole, Dryness, Hives, Jaundice, New Lesions, Non-Healing Wounds, Rash and Ulcer. HEENT  Present- Seasonal Allergies and Sore Throat. Not Present- Earache, Hearing Loss, Hoarseness, Nose Bleed, Oral Ulcers, Ringing in the Ears, Sinus Pain, Visual Disturbances, Wears glasses/contact lenses and Yellow Eyes. Respiratory Not Present- Bloody sputum, Chronic Cough, Difficulty Breathing, Snoring and Wheezing. Cardiovascular Present- Leg Cramps. Not Present- Chest Pain, Difficulty Breathing Lying Down, Palpitations, Rapid Heart Rate, Shortness of Breath and Swelling of Extremities. Gastrointestinal Present- Abdominal Pain. Not Present- Bloating, Bloody Stool, Change in Bowel Habits, Chronic diarrhea, Constipation, Difficulty Swallowing, Excessive gas, Gets full quickly at meals, Hemorrhoids, Indigestion, Nausea, Rectal Pain and Vomiting. Female Genitourinary Not Present- Frequency, Nocturia, Painful Urination, Pelvic Pain and Urgency. Musculoskeletal Present- Muscle Pain. Not Present- Back Pain, Joint Pain, Joint Stiffness, Muscle Weakness and Swelling of Extremities. Neurological Not Present- Decreased Memory, Fainting, Headaches, Numbness, Seizures, Tingling, Tremor, Trouble walking and Weakness. Psychiatric Not Present- Anxiety, Bipolar, Change in Sleep Pattern, Depression, Fearful and Frequent crying. Endocrine Not Present- Cold Intolerance, Excessive Hunger, Hair Changes, Heat Intolerance, Hot flashes and New Diabetes. Hematology Not Present- Easy Bruising, Excessive bleeding, Gland problems, HIV and Persistent Infections.  Vitals Weight: 198 lb Height: 62in Body Surface Area: 1.9 m Body Mass Index: 36.21 kg/m  Pulse: 76 (Regular)  BP: 128/78 (Sitting, Left Arm, Standard)    Physical Exam  General Mental Status-Alert. General Appearance-Consistent with stated age. Hydration-Well hydrated. Voice-Normal.  Head and Neck Head-normocephalic, atraumatic with no lesions or palpable masses. Trachea-midline. Thyroid Gland Characteristics - normal size and  consistency.  Eye Eyeball - Bilateral-Extraocular movements intact. Sclera/Conjunctiva - Bilateral-No scleral icterus.  Chest and Lung Exam Chest and lung exam reveals -quiet, even and easy respiratory effort with no use of accessory muscles and on  auscultation, normal breath sounds, no adventitious sounds and normal vocal resonance. Inspection Chest Wall - Normal. Back - normal.  Cardiovascular Cardiovascular examination reveals -normal heart sounds, regular rate and rhythm with no murmurs and normal pedal pulses bilaterally.  Abdomen Inspection Inspection of the abdomen reveals - No Hernias. Palpation/Percussion Palpation and Percussion of the abdomen reveal - Soft, No Rebound tenderness, No Rigidity (guarding) and No hepatosplenomegaly. Note: mild epigastric tenderness. Auscultation Auscultation of the abdomen reveals - Bowel sounds normal.  Neurologic Neurologic evaluation reveals -alert and oriented x 3 with no impairment of recent or remote memory. Mental Status-Normal.  Musculoskeletal Global Assessment -Note: no gross deformities.  Normal Exam - Left-Upper Extremity Strength Normal and Lower Extremity Strength Normal. Normal Exam - Right-Upper Extremity Strength Normal and Lower Extremity Strength Normal.  Lymphatic Head & Neck  General Head & Neck Lymphatics: Bilateral - Description - Normal. Axillary  General Axillary Region: Bilateral - Description - Normal. Tenderness - Non Tender. Femoral & Inguinal  Generalized Femoral & Inguinal Lymphatics: Bilateral - Description - No Generalized lymphadenopathy.    Assessment & Plan  SYMPTOMATIC CHOLELITHIASIS (K80.20) Impression: Patient has classic symptoms of gallbladder disease.  Planned a laparoscopic cholecystectomy. She has had elevated alkaline phosphatase, AST and ALT, so I will plan to do a cholangiogram.  The surgical procedure was described to the patient in detail. The patient was  given educational material. I discussed the incision type and location, the location of the gallbladder, the anatomy of the bile ducts and arteries, and the typical progression of surgery. I discussed the possibility of converting to an open operation. I advised of the risks of bleeding, infection, damage to other structures (such as the bile duct, intestine or liver), bile leak, need for other procedures or surgeries, and post op diarrhea/constipation. We discussed the risk of blood clot. We discussed the recovery period and post operative restrictions. The patient was advised against taking blood thinners the week before surgery.  30 min spent in evaluation, examination, counseling, and coordination of care. >50% spent in counseling. Current Plans Pt Education - Laparoscopic Cholecystectomy: gallbladder You are being scheduled for surgery - Our schedulers will call you.  You should hear from our office's scheduling department within 5 working days about the location, date, and time of surgery. We try to make accommodations for patient's preferences in scheduling surgery, but sometimes the OR schedule or the surgeon's schedule prevents [place] from making those accommodations.  If you have not heard from our office [phone]) in 5 working days, call the office and ask for your surgeon's nurse.  If you have other questions about your diagnosis, plan, or surgery, call the office and ask for your surgeon's nurse.  Pt Education - Pamphlet Given - Laparoscopic Gallbladder Surgery: discussed with patient and provided information.   Signed by Stark Klein, MD

## 2015-05-04 NOTE — Anesthesia Preprocedure Evaluation (Addendum)
Anesthesia Evaluation  Patient identified by MRN, date of birth, ID band Patient awake    Reviewed: Allergy & Precautions, NPO status , Patient's Chart, lab work & pertinent test results  Airway Mallampati: II  TM Distance: >3 FB Neck ROM: Full    Dental no notable dental hx. (+) Teeth Intact, Dental Advisory Given   Pulmonary neg pulmonary ROS,    Pulmonary exam normal breath sounds clear to auscultation       Cardiovascular negative cardio ROS Normal cardiovascular exam Rhythm:Regular Rate:Normal     Neuro/Psych negative neurological ROS  negative psych ROS   GI/Hepatic Neg liver ROS, GERD  ,  Endo/Other  Obesity  Renal/GU Renal diseaseHx/o renal calculi  negative genitourinary   Musculoskeletal   Abdominal (+) + obese,   Peds  Hematology  (+) anemia ,   Anesthesia Other Findings   Reproductive/Obstetrics                           Anesthesia Physical  Anesthesia Plan  ASA: II  Anesthesia Plan: General   Post-op Pain Management:    Induction: Intravenous  Airway Management Planned: Oral ETT  Additional Equipment:   Intra-op Plan:   Post-operative Plan: Extubation in OR  Informed Consent: I have reviewed the patients History and Physical, chart, labs and discussed the procedure including the risks, benefits and alternatives for the proposed anesthesia with the patient or authorized representative who has indicated his/her understanding and acceptance.   Dental advisory given  Plan Discussed with: Anesthesiologist, CRNA and Surgeon  Anesthesia Plan Comments:         Anesthesia Quick Evaluation

## 2015-05-04 NOTE — Op Note (Signed)
Laparoscopic Cholecystectomy with IOC Procedure Note  Indications: This patient presents with biliary colic and will undergo laparoscopic cholecystectomy.  Pre-operative Diagnosis: See above  Post-operative Diagnosis: Same  Surgeon: Stark Klein   Assistants: Sharyn Dross, RNFA  Anesthesia: General endotracheal anesthesia and local  ASA Class: 2  Procedure Details  The patient was seen again in the Holding Room. The risks, benefits, complications, treatment options, and expected outcomes were discussed with the patient. The possibilities of  bleeding, recurrent infection, damage to nearby structures, the need for additional procedures, failure to diagnose a condition, the possible need to convert to an open procedure, and creating a complication requiring transfusion or operation were discussed with the patient. The likelihood of improving the patient's symptoms with return to their baseline status is good.    The patient and/or family concurred with the proposed plan, giving informed consent. The site of surgery properly noted. The patient was taken to Operating Room, and the procedure verified as Laparoscopic Cholecystectomy with Intraoperative Cholangiogram. A Time Out was held and the above information confirmed.  Prior to the induction of general anesthesia, antibiotic prophylaxis was administered. General endotracheal anesthesia was then administered and tolerated well. After the induction, the abdomen was prepped with Chloraprep and draped in the sterile fashion. The patient was positioned in the supine position.  Local anesthetic agent was injected into the skin near the umbilicus and an incision made. We dissected down to the abdominal fascia with blunt dissection.  The fascia was incised vertically and we entered the peritoneal cavity bluntly.  A pursestring suture of 0-Vicryl was placed around the fascial opening.  The Hasson cannula was inserted and secured with the stay suture.   Pneumoperitoneum was then created with CO2 and tolerated well without any adverse changes in the patient's vital signs. An 11-mm port was placed in the subxiphoid position.  Two 5-mm ports were placed in the right upper quadrant. All skin incisions were infiltrated with a local anesthetic agent before making the incision and placing the trocars.   We positioned the patient in reverse Trendelenburg, tilted slightly to the patient's left.  The gallbladder was identified, the fundus grasped and retracted cephalad. Adhesions were lysed bluntly and with the electrocautery where indicated, taking care not to injure any adjacent organs or viscus. The infundibulum was grasped and retracted laterally, exposing the peritoneum overlying the triangle of Calot. This was then divided and exposed in a blunt fashion. A critical view of the cystic duct and cystic artery was obtained.  The cystic duct was clearly identified and bluntly dissected circumferentially. The cystic duct was ligated with a clip distally.   An incision was made in the cystic duct and the [facility] cholangiogram catheter introduced. The catheter was secured using a clip. A cholangiogram was then performed, demonstrating good filling of left and right hepatic duct, common duct, and duodenum without filling defects.  .  The cystic duct was then ligated with clips and divided. The cystic artery was identified, dissected free, ligated with clips and divided as well.   The gallbladder was dissected from the liver bed in retrograde fashion with the electrocautery. The gallbladder was removed and placed in an Endocatch bag.  The gallbladder and Endocatch bag were then removed through the umbilical port site.  The liver bed was irrigated and inspected. Hemostasis was achieved with the electrocautery. Copious irrigation was utilized and was repeatedly aspirated until clear.    We again inspected the right upper quadrant for hemostasis.  Pneumoperitoneum was released  as we removed the trocars.   The pursestring suture was used to close the umbilical fascia.  4-0 Monocryl was used to close the skin.   The skin was cleaned and dry, and Dermabond was applied. The patient was then extubated and brought to the recovery room in stable condition. Instrument, sponge, and needle counts were correct at closure and at the conclusion of the case.   Findings: Mild chronic inflammation.    Estimated Blood Loss: min         Drains: none          Specimens: Gallbladder to pathology       Complications: None; patient tolerated the procedure well.         Disposition: PACU - hemodynamically stable.         Condition: stable

## 2015-05-04 NOTE — Transfer of Care (Signed)
Immediate Anesthesia Transfer of Care Note  Patient: Caroline Yang  Procedure(s) Performed: Procedure(s): LAPAROSCOPIC CHOLECYSTECTOMY WITH INTRAOPERATIVE CHOLANGIOGRAM (N/A)  Patient Location: PACU  Anesthesia Type:General  Level of Consciousness: sedated, patient cooperative and responds to stimulation  Airway & Oxygen Therapy: Patient Spontanous Breathing and Patient connected to nasal cannula oxygen  Post-op Assessment: Report given to RN and Post -op Vital signs reviewed and stable  Post vital signs: Reviewed and stable  Last Vitals:  Filed Vitals:   05/04/15 1400  BP: 117/61  Pulse: 80  Temp: 36.9 C  Resp: 18    Complications: No apparent anesthesia complications

## 2015-05-05 ENCOUNTER — Encounter (HOSPITAL_COMMUNITY): Payer: Self-pay | Admitting: General Surgery

## 2016-08-03 IMAGING — US US ABDOMEN COMPLETE
1 series · 13 of 25 positions shown · non-contrast
Comparison: Abdominal ultrasound October 12, 2014

CLINICAL DATA: Five months of mid abdominal pain and bloating
without nausea or vomiting, history of kidney stones and polycystic
ovarian syndrome.

EXAM:
ULTRASOUND ABDOMEN COMPLETE

[Series 1: us abdomen complete · 0.30mm/px · 13 of 82 slices shown]
[im 1/82]
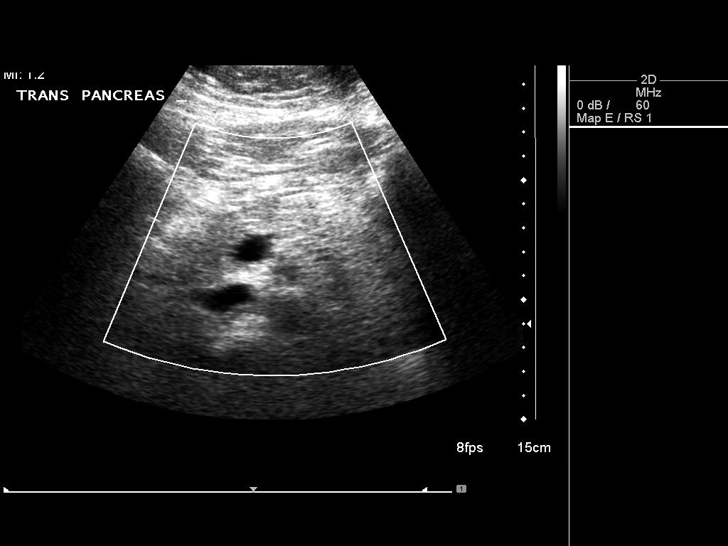
[im 7/82]
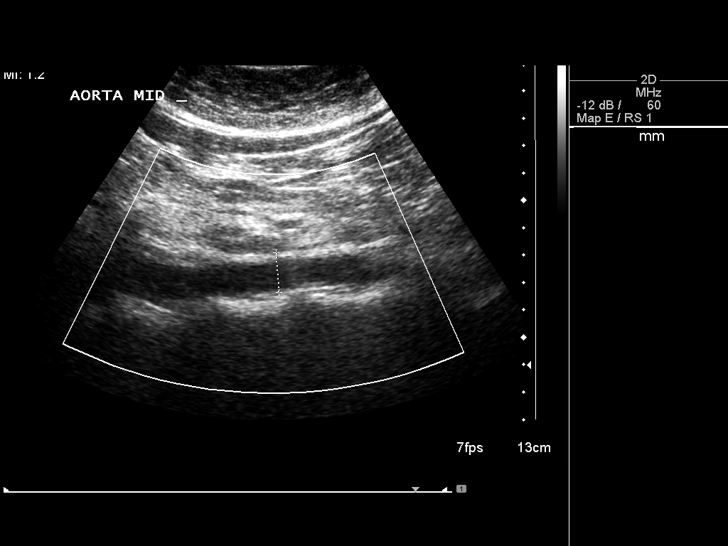
[im 14/82]
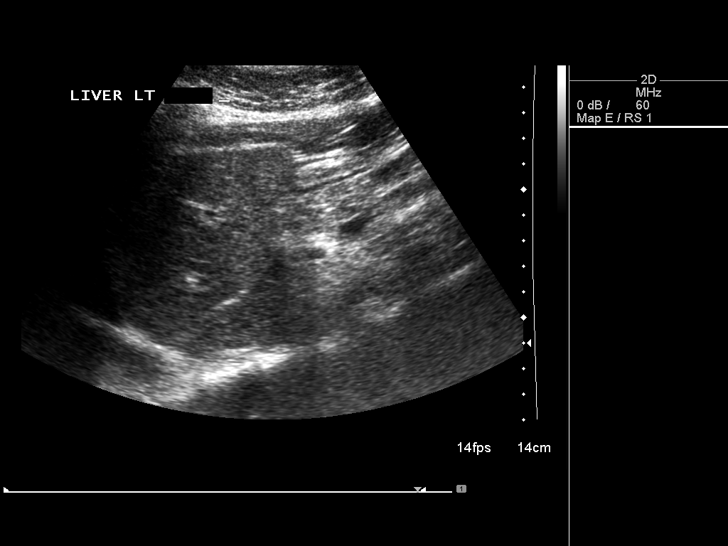
[im 21/82]
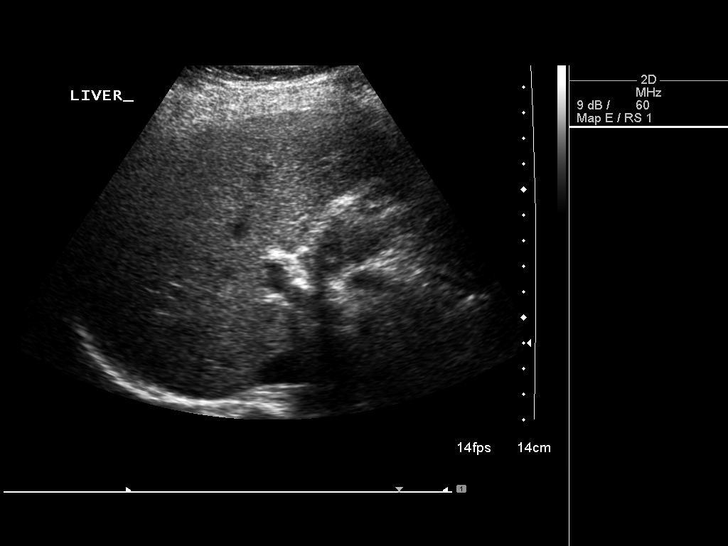
[im 28/82]
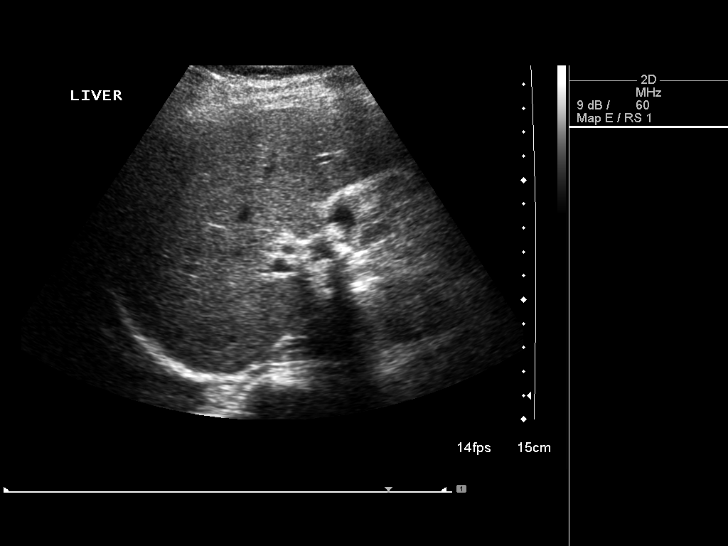
[im 34/82]
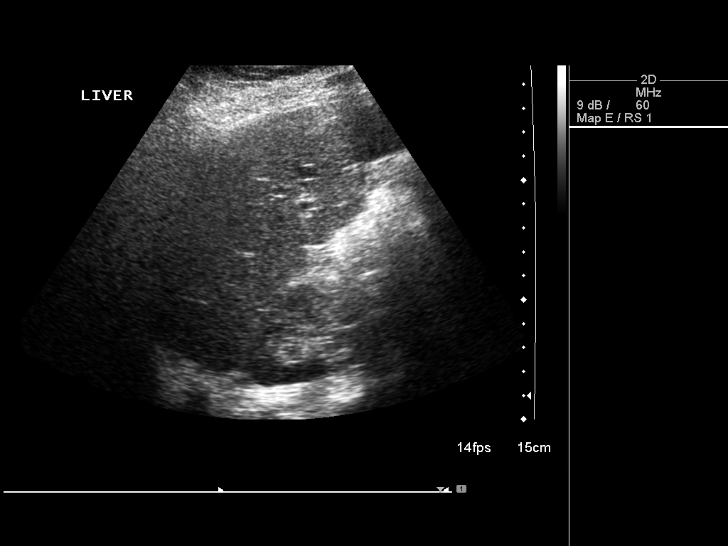
[im 41/82]
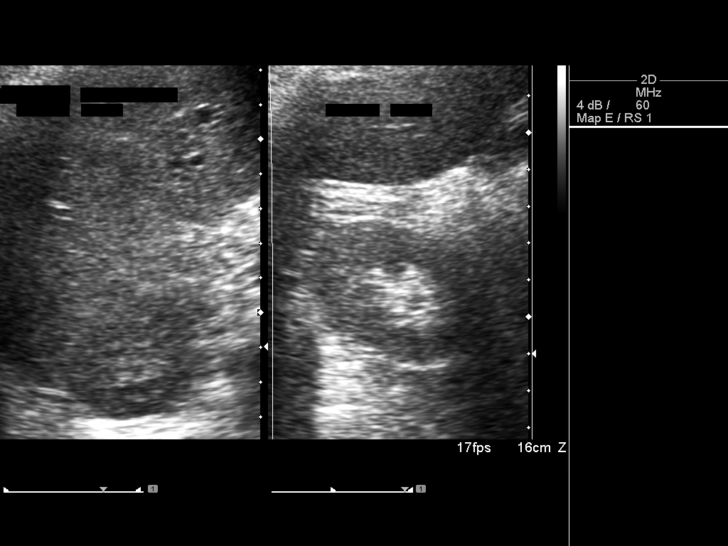
[im 48/82]
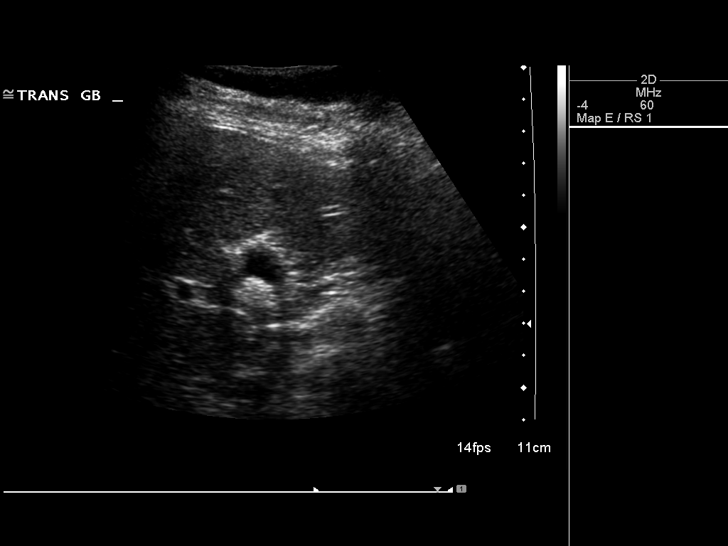
[im 55/82]
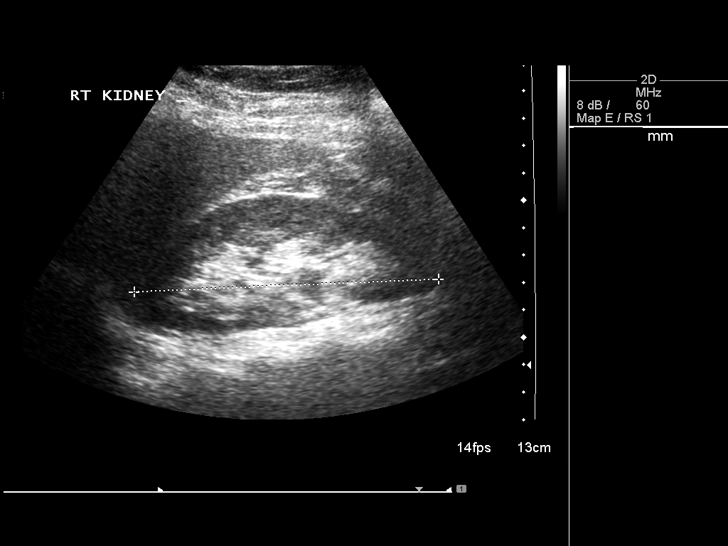
[im 61/82]
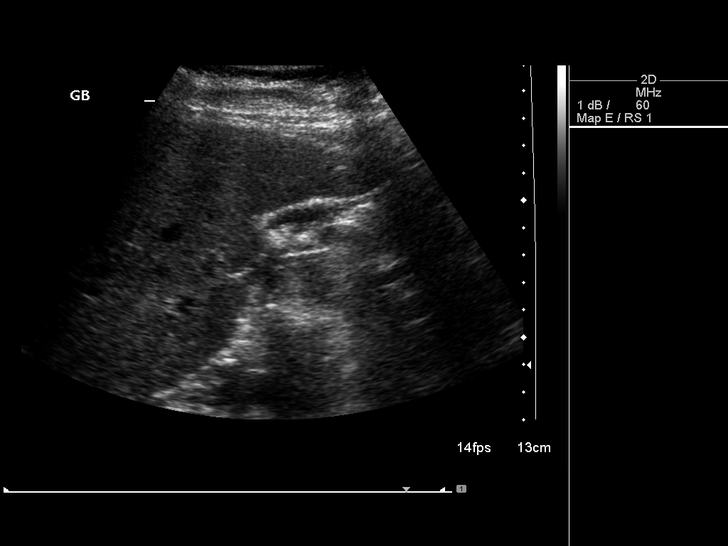
[im 68/82]
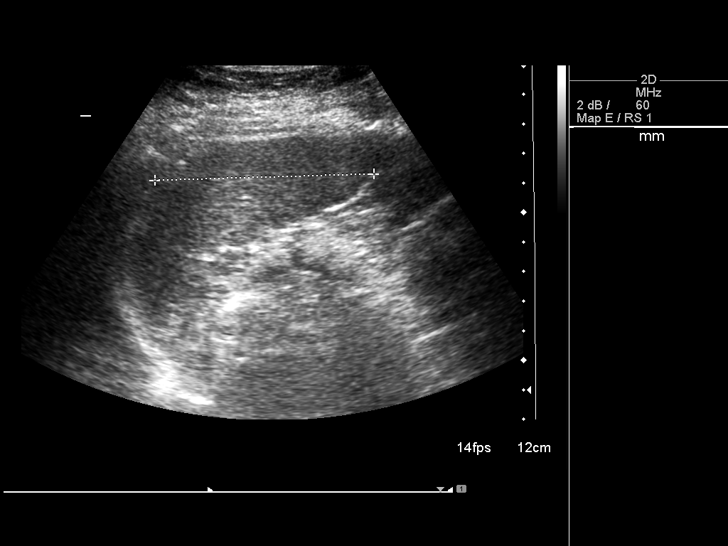
[im 75/82]
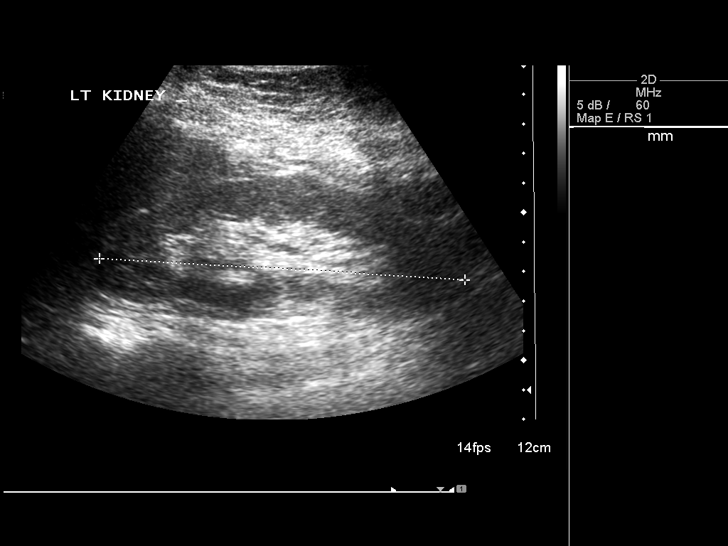
[im 82/82]
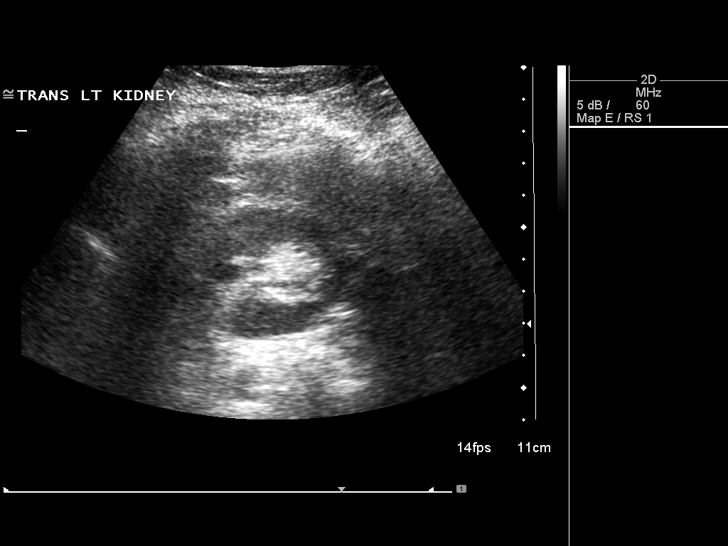

[13 of 25 positions shown; findings below may reference images not displayed]

FINDINGS: Gallbladder: The gallbladder is adequately distended and contains
multiple echogenic mobile shadowing stones. The largest measures 11
mm in diameter. There is no gallbladder wall thickening,
pericholecystic fluid, or positive sonographic Murphy's sign.

Common bile duct: Diameter: 2.9 mm

Liver: The liver exhibits normal echotexture with no focal mass or
ductal dilation. The surface contour of the liver is normal.

IVC: No abnormality visualized.

Pancreas: Evaluation the pancreas was markedly limited due to bowel
gas. Only portions of the pancreatic body could be adequately
assessed.

Spleen: Size and appearance within normal limits.

Right Kidney: Length: 11.1 cm. Echogenicity within normal limits. No
mass or hydronephrosis visualized.

Left Kidney: Length: 12.4 cm. Echogenicity within normal limits. No
mass or hydronephrosis visualized.

Abdominal aorta: No aneurysm visualized.

Other findings: There is no ascites.
IMPRESSION: 1. Gallstones without sonographic evidence of acute cholecystitis.
The liver and common bile duct and spleen are unremarkable.
2. Limited visualization of the pancreas due to bowel gas.
3. The kidneys are unremarkable.

## 2017-02-01 ENCOUNTER — Encounter: Payer: Self-pay | Admitting: Family Medicine

## 2017-02-01 ENCOUNTER — Ambulatory Visit: Payer: BLUE CROSS/BLUE SHIELD | Admitting: Family Medicine

## 2017-02-01 VITALS — BP 116/78 | HR 95 | Temp 98.7°F | Resp 16 | Ht 62.0 in | Wt 205.0 lb

## 2017-02-01 DIAGNOSIS — M5412 Radiculopathy, cervical region: Secondary | ICD-10-CM | POA: Diagnosis not present

## 2017-02-01 DIAGNOSIS — M542 Cervicalgia: Secondary | ICD-10-CM | POA: Diagnosis not present

## 2017-02-01 MED ORDER — METHOCARBAMOL 500 MG PO TABS: 500.0000 mg | ORAL_TABLET | Freq: Four times a day (QID) | ORAL | 0 refills | Status: DC

## 2017-02-01 MED ORDER — PREDNISONE 20 MG PO TABS: ORAL_TABLET | ORAL | 0 refills | Status: DC

## 2017-02-01 MED ORDER — DICLOFENAC SODIUM 75 MG PO TBEC: 75.0000 mg | DELAYED_RELEASE_TABLET | Freq: Two times a day (BID) | ORAL | 0 refills | Status: DC

## 2017-02-01 NOTE — Patient Instructions (Addendum)
Take prednisone 3 pills daily for 2 days, then 2 daily for 2 days, then 1 daily for 2 days for inflammation of neck.  Take after breakfast.  Take diclofenac 1 twice daily for pain in the neck.  If the diclofenac alone does not relieve the pain enough, you can continue taking Tylenol (acetaminophen) 500 mg maximum of 2 pills 3 times daily  Take the methocarbamol muscle relaxant 1 pill 4 times daily.  If this causes you to be drowsy you may have to decrease to 1/2 pill, but try taking a whole pill 4 times a day if you can for a few days to relax the neck and shoulder muscles.  If you are not improving you may have to have a x-ray or MRI scan of your neck.  Return if necessary.    IF you received an x-ray today, you will receive an invoice from [facility]. Please contact [facility] at [phone] with questions or concerns regarding your invoice.   IF you received labwork today, you will receive an invoice from [facility]. Please contact [facility] at [phone] with questions or concerns regarding your invoice.   Our billing staff will not be able to assist you with questions regarding bills from these companies.  You will be contacted with the lab results as soon as they are available. The fastest way to get your results is to activate your My Chart account. Instructions are located on the last page of this paperwork. If you have not heard from [place] regarding the results in 2 weeks, please contact this office.

## 2017-02-01 NOTE — Progress Notes (Signed)
Patient ID: Caroline Yang, female    DOB: 1980-09-07  Age: 36 y.o. MRN: [id number]  Chief Complaint  Patient presents with  . Neck Pain    right necck, shoulder and back pain x 3 days     Subjective:   36 year old [place] American lady who presents with a history of 3 days of having a lot of pain in the right side of her neck.  It goes up into the head.  It radiates down her right arm.  She has been having intermittent problems with the radicular pain down the right arm for the last year.  She has not had any major injuries.  She is in school, and works hard with caring for 3 children.  The youngest is still small enough that she has to carry frequently.  She is not pregnant, has a implant for contraception.  They are getting ready to go to [place]  in about a week, and want to be ready for the long flight.  Current allergies, medications, problem list, past/family and social histories reviewed.  Objective:  BP 116/78   Pulse 95   Temp 98.7 F (37.1 C)   Resp 16   Ht 5\' 2"  (1.575 m)   Wt 205 lb (93 kg)   SpO2 100%   BMI 37.49 kg/m   Alert and oriented.  Tender in the right side of her neck.  Fair range of motion but on extension of the neck it causes a great deal of pain.  Right arm strength is is good.  Good range of motion.  Grip is good.  Describes a radicular discomfort down to her right forearm.  Assessment & Plan:   Assessment: 1. Cervical pain (neck)   2. Cervical radiculitis       Plan: See instructions.  If she does not improve with this treatment she may need a x-ray and possibly an MRI of her neck.  No orders of the defined types were placed in this encounter.   Meds ordered this encounter  Medications  . methocarbamol (ROBAXIN) 500 MG tablet    Sig: Take 1 tablet (500 mg total) by mouth 4 (four) times daily.    Dispense:  40 tablet    Refill:  0  . diclofenac (VOLTAREN) 75 MG EC tablet    Sig: Take 1 tablet (75 mg total) by mouth 2 (two) times daily.    Dispense:  30 tablet    Refill:  0  . predniSONE (DELTASONE) 20 MG tablet    Sig: Take 3 daily for 2 days, then 2 daily for 2 days, then 1 daily for 2 days.  Take after breakfast.    Dispense:  12 tablet    Refill:  0         Patient Instructions   Take prednisone 3 pills daily for 2 days, then 2 daily for 2 days, then 1 daily for 2 days for inflammation of neck.  Take after breakfast.  Take diclofenac 1 twice daily for pain in the neck.  If the diclofenac alone does not relieve the pain enough, you can continue taking Tylenol (acetaminophen) 500 mg maximum of 2 pills 3 times daily  Take the methocarbamol muscle relaxant 1 pill 4 times daily.  If this causes you to be drowsy you may have to decrease to 1/2 pill, but try taking a whole pill 4 times a day if you can for a few days to relax the neck and shoulder muscles.  If you  are not improving you may have to have a x-ray or MRI scan of your neck.  Return if necessary.    IF you received an x-ray today, you will receive an invoice from [facility]. Please contact [facility] at [phone] with questions or concerns regarding your invoice.   IF you received labwork today, you will receive an invoice from [facility]. Please contact [facility] at [phone] with questions or concerns regarding your invoice.   Our billing staff will not be able to assist you with questions regarding bills from these companies.  You will be contacted with the lab results as soon as they are available. The fastest way to get your results is to activate your My Chart account. Instructions are located on the last page of this paperwork. If you have not heard from [place] regarding the results in 2 weeks, please contact this office.         Return if symptoms worsen or fail to improve.   Irene Collings, MD 02/01/2017

## 2019-06-10 ENCOUNTER — Encounter (HOSPITAL_COMMUNITY): Payer: Self-pay | Admitting: Pediatrics

## 2019-06-10 ENCOUNTER — Other Ambulatory Visit: Payer: Self-pay

## 2019-06-10 ENCOUNTER — Emergency Department (HOSPITAL_COMMUNITY): Payer: BLUE CROSS/BLUE SHIELD

## 2019-06-10 ENCOUNTER — Emergency Department (HOSPITAL_COMMUNITY)
Admission: EM | Admit: 2019-06-10 | Discharge: 2019-06-11 | Disposition: A | Discharge status: Home / Self Care | Payer: BLUE CROSS/BLUE SHIELD | Attending: Emergency Medicine | Admitting: Emergency Medicine

## 2019-06-10 DIAGNOSIS — Z5321 Procedure and treatment not carried out due to patient leaving prior to being seen by health care provider: Secondary | ICD-10-CM | POA: Insufficient documentation

## 2019-06-10 DIAGNOSIS — R079 Chest pain, unspecified: Secondary | ICD-10-CM | POA: Diagnosis not present

## 2019-06-10 LAB — CBC
HCT: 33.3 % — ABNORMAL LOW (ref 36.0–46.0)
Hemoglobin: 10.3 g/dL — ABNORMAL LOW (ref 12.0–15.0)
MCH: 24.5 pg — ABNORMAL LOW (ref 26.0–34.0)
MCHC: 30.9 g/dL (ref 30.0–36.0)
MCV: 79.3 fL — ABNORMAL LOW (ref 80.0–100.0)
Platelets: 383 10*3/uL (ref 150–400)
RBC: 4.2 MIL/uL (ref 3.87–5.11)
RDW: 14.6 % (ref 11.5–15.5)
WBC: 5.7 10*3/uL (ref 4.0–10.5)
nRBC: 0 % (ref 0.0–0.2)

## 2019-06-10 LAB — BASIC METABOLIC PANEL
Anion gap: 10 (ref 5–15)
BUN: <5 mg/dL — ABNORMAL LOW (ref 6–20)
CO2: 22 mmol/L (ref 22–32)
Calcium: 9.1 mg/dL (ref 8.9–10.3)
Chloride: 107 mmol/L (ref 98–111)
Creatinine, Ser: 0.43 mg/dL — ABNORMAL LOW (ref 0.44–1.00)
GFR calc Af Amer: >60 mL/min (ref >60)
GFR calc non Af Amer: >60 mL/min (ref >60)
Glucose, Bld: 110 mg/dL — ABNORMAL HIGH (ref 70–99)
Potassium: 3.7 mmol/L (ref 3.5–5.1)
Sodium: 139 mmol/L (ref 135–145)

## 2019-06-10 LAB — I-STAT BETA HCG BLOOD, ED (MC, WL, AP ONLY): I-stat hCG, quantitative: <5 m[IU]/mL (ref <5)

## 2019-06-10 LAB — TROPONIN I (HIGH SENSITIVITY)
Troponin I (High Sensitivity): <2 ng/L (ref <18)
Troponin I (High Sensitivity): 3 ng/L (ref <18)

## 2019-06-10 MED ORDER — SODIUM CHLORIDE 0.9% FLUSH: 3.0000 mL | Freq: Once | INTRAVENOUS | Status: DC

## 2019-06-10 NOTE — ED Triage Notes (Signed)
C/o right sided chest pain, on shoulder and back as well x 2 days ; denies trauma, denies PMH

## 2019-06-11 NOTE — ED Notes (Signed)
Patient stated she had to go home to her kids.will return

## 2019-12-24 LAB — OB RESULTS CONSOLE ABO/RH: RH Type: POSITIVE

## 2019-12-24 LAB — OB RESULTS CONSOLE RPR: RPR: NONREACTIVE

## 2019-12-24 LAB — OB RESULTS CONSOLE RUBELLA ANTIBODY, IGM: Rubella: IMMUNE

## 2019-12-24 LAB — OB RESULTS CONSOLE ANTIBODY SCREEN: Antibody Screen: NEGATIVE

## 2019-12-24 LAB — OB RESULTS CONSOLE HIV ANTIBODY (ROUTINE TESTING): HIV: NONREACTIVE

## 2019-12-24 LAB — OB RESULTS CONSOLE HEPATITIS B SURFACE ANTIGEN: Hepatitis B Surface Ag: NEGATIVE

## 2019-12-24 LAB — OB RESULTS CONSOLE GC/CHLAMYDIA
Chlamydia: NEGATIVE
Gonorrhea: NEGATIVE

## 2020-01-09 ENCOUNTER — Encounter (HOSPITAL_COMMUNITY): Payer: Self-pay | Admitting: Obstetrics and Gynecology

## 2020-01-09 ENCOUNTER — Inpatient Hospital Stay (HOSPITAL_COMMUNITY): Payer: BLUE CROSS/BLUE SHIELD

## 2020-01-09 ENCOUNTER — Inpatient Hospital Stay (HOSPITAL_COMMUNITY)
Admission: AD | Admit: 2020-01-09 | Discharge: 2020-01-09 | Disposition: A | Discharge status: Home / Self Care | Payer: BLUE CROSS/BLUE SHIELD | Attending: Obstetrics and Gynecology | Admitting: Obstetrics and Gynecology

## 2020-01-09 ENCOUNTER — Other Ambulatory Visit: Payer: Self-pay

## 2020-01-09 DIAGNOSIS — O99281 Endocrine, nutritional and metabolic diseases complicating pregnancy, first trimester: Secondary | ICD-10-CM | POA: Insufficient documentation

## 2020-01-09 DIAGNOSIS — R319 Hematuria, unspecified: Secondary | ICD-10-CM

## 2020-01-09 DIAGNOSIS — R112 Nausea with vomiting, unspecified: Secondary | ICD-10-CM

## 2020-01-09 DIAGNOSIS — Z3A08 8 weeks gestation of pregnancy: Secondary | ICD-10-CM

## 2020-01-09 DIAGNOSIS — O210 Mild hyperemesis gravidarum: Secondary | ICD-10-CM | POA: Diagnosis not present

## 2020-01-09 DIAGNOSIS — Z880 Allergy status to penicillin: Secondary | ICD-10-CM | POA: Insufficient documentation

## 2020-01-09 DIAGNOSIS — E860 Dehydration: Secondary | ICD-10-CM

## 2020-01-09 DIAGNOSIS — O99891 Other specified diseases and conditions complicating pregnancy: Secondary | ICD-10-CM | POA: Diagnosis not present

## 2020-01-09 DIAGNOSIS — Z7952 Long term (current) use of systemic steroids: Secondary | ICD-10-CM | POA: Diagnosis not present

## 2020-01-09 DIAGNOSIS — R82998 Other abnormal findings in urine: Secondary | ICD-10-CM | POA: Diagnosis not present

## 2020-01-09 DIAGNOSIS — R109 Unspecified abdominal pain: Secondary | ICD-10-CM | POA: Diagnosis present

## 2020-01-09 DIAGNOSIS — Z791 Long term (current) use of non-steroidal anti-inflammatories (NSAID): Secondary | ICD-10-CM | POA: Insufficient documentation

## 2020-01-09 DIAGNOSIS — O211 Hyperemesis gravidarum with metabolic disturbance: Secondary | ICD-10-CM

## 2020-01-09 LAB — URINALYSIS, ROUTINE W REFLEX MICROSCOPIC
Bacteria, UA: NONE SEEN
Bilirubin Urine: NEGATIVE
Glucose, UA: 50 mg/dL — AB
Ketones, ur: 20 mg/dL — AB
Leukocytes,Ua: NEGATIVE
Nitrite: NEGATIVE
Protein, ur: 100 mg/dL — AB
RBC / HPF: >50 RBC/hpf — ABNORMAL HIGH (ref 0–5)
Specific Gravity, Urine: 1.023 (ref 1.005–1.030)
pH: 5 (ref 5.0–8.0)

## 2020-01-09 LAB — BASIC METABOLIC PANEL
Anion gap: 12 (ref 5–15)
BUN: 5 mg/dL — ABNORMAL LOW (ref 6–20)
CO2: 22 mmol/L (ref 22–32)
Calcium: 9.5 mg/dL (ref 8.9–10.3)
Chloride: 102 mmol/L (ref 98–111)
Creatinine, Ser: 0.52 mg/dL (ref 0.44–1.00)
GFR, Estimated: >60 mL/min (ref >60)
Glucose, Bld: 109 mg/dL — ABNORMAL HIGH (ref 70–99)
Potassium: 3.4 mmol/L — ABNORMAL LOW (ref 3.5–5.1)
Sodium: 136 mmol/L (ref 135–145)

## 2020-01-09 LAB — POCT PREGNANCY, URINE: Preg Test, Ur: POSITIVE — AB

## 2020-01-09 LAB — CBC
HCT: 35.2 % — ABNORMAL LOW (ref 36.0–46.0)
Hemoglobin: 11.2 g/dL — ABNORMAL LOW (ref 12.0–15.0)
MCH: 25.6 pg — ABNORMAL LOW (ref 26.0–34.0)
MCHC: 31.8 g/dL (ref 30.0–36.0)
MCV: 80.5 fL (ref 80.0–100.0)
Platelets: 370 10*3/uL (ref 150–400)
RBC: 4.37 MIL/uL (ref 3.87–5.11)
RDW: 16.8 % — ABNORMAL HIGH (ref 11.5–15.5)
WBC: 7.8 10*3/uL (ref 4.0–10.5)
nRBC: 0 % (ref 0.0–0.2)

## 2020-01-09 MED ORDER — HYOSCYAMINE SULFATE 0.125 MG SL SUBL
0.1250 mg | SUBLINGUAL_TABLET | Freq: Once | SUBLINGUAL | Status: AC
  Administered 2020-01-09: 0.125 mg via SUBLINGUAL
  Filled 2020-01-09 (×2): qty 1

## 2020-01-09 MED ORDER — ONDANSETRON HCL 4 MG/2ML IJ SOLN
4.0000 mg | Freq: Once | INTRAMUSCULAR | Status: AC
  Administered 2020-01-09: 4 mg via INTRAVENOUS
  Filled 2020-01-09: qty 2

## 2020-01-09 MED ORDER — ONDANSETRON 4 MG PO TBDP: 4.0000 mg | ORAL_TABLET | Freq: Four times a day (QID) | ORAL | 0 refills | Status: DC | PRN

## 2020-01-09 MED ORDER — LACTATED RINGERS IV SOLN: INTRAVENOUS | Status: DC

## 2020-01-09 MED ORDER — SODIUM CHLORIDE 0.9 % IV SOLN: INTRAVENOUS | Status: DC

## 2020-01-09 MED ORDER — HYDROMORPHONE HCL 1 MG/ML IJ SOLN
1.0000 mg | Freq: Once | INTRAMUSCULAR | Status: AC
  Administered 2020-01-09: 1 mg via INTRAVENOUS
  Filled 2020-01-09: qty 1

## 2020-01-09 MED ORDER — SODIUM CHLORIDE 0.9 % IV SOLN: Freq: Once | INTRAVENOUS | Status: AC

## 2020-01-09 MED ORDER — LACTATED RINGERS IV SOLN: Freq: Once | INTRAVENOUS | Status: DC

## 2020-01-09 MED ORDER — OXYCODONE-ACETAMINOPHEN 5-325 MG PO TABS
1.0000 | ORAL_TABLET | Freq: Four times a day (QID) | ORAL | 0 refills | Status: DC | PRN
Start: 2020-01-09 — End: 2020-02-14

## 2020-01-09 MED ORDER — TAMSULOSIN HCL 0.4 MG PO CAPS
0.8000 mg | ORAL_CAPSULE | Freq: Once | ORAL | Status: AC
  Administered 2020-01-09: 0.8 mg via ORAL
  Filled 2020-01-09: qty 2

## 2020-01-09 NOTE — Discharge Instructions (Signed)
Dehydration, Adult Dehydration is condition in which there is not enough water or other fluids in the body. This happens when a person loses more fluids than he or she takes in. Important body parts cannot work right without the right amount of fluids. Any loss of fluids from the body can cause dehydration. Dehydration can be mild, worse, or very bad. It should be treated right away to keep it from getting very bad. What are the causes? This condition may be caused by:  Conditions that cause loss of water or other fluids, such as: ? Watery poop (diarrhea). ? Vomiting. ? Sweating a lot. ? Peeing (urinating) a lot.  Not drinking enough fluids, especially when you: ? Are ill. ? Are doing things that take a lot of energy to do.  Other illnesses and conditions, such as fever or infection.  Certain medicines, such as medicines that take extra fluid out of the body (diuretics).  Lack of safe drinking water.  Not being able to get enough water and food. What increases the risk? The following factors may make you more likely to develop this condition:  Having a long-term (chronic) illness that has not been treated the right way, such as: ? Diabetes. ? Heart disease. ? Kidney disease.  Being 65 years of age or older.  Having a disability.  Living in a place that is high above the ground or sea (high in altitude). The thinner, dried air causes more fluid loss.  Doing exercises that put stress on your body for a long time. What are the signs or symptoms? Symptoms of dehydration depend on how bad it is. Mild or worse dehydration  Thirst.  Dry lips or dry mouth.  Feeling dizzy or light-headed, especially when you stand up from sitting.  Muscle cramps.  Your body making: ? Dark pee (urine). Pee may be the color of tea. ? Less pee than normal. ? Less tears than normal.  Headache. Very bad dehydration  Changes in skin. Skin may: ? Be cold to the touch (clammy). ? Be blotchy  or pale. ? Not go back to normal right after you lightly pinch it and let it go.  Little or no tears, pee, or sweat.  Changes in vital signs, such as: ? Fast breathing. ? Low blood pressure. ? Weak pulse. ? Pulse that is more than 100 beats a minute when you are sitting still.  Other changes, such as: ? Feeling very thirsty. ? Eyes that look hollow (sunken). ? Cold hands and feet. ? Being mixed up (confused). ? Being very tired (lethargic) or having trouble waking from sleep. ? Short-term weight loss. ? Loss of consciousness. How is this treated? Treatment for this condition depends on how bad it is. Treatment should start right away. Do not wait until your condition gets very bad. Very bad dehydration is an emergency. You will need to go to a hospital.  Mild or worse dehydration can be treated at home. You may be asked to: ? Drink more fluids. ? Drink an oral rehydration solution (ORS). This drink helps get the right amounts of fluids and salts and minerals in the blood (electrolytes).  Very bad dehydration can be treated: ? With fluids through an IV tube. ? By getting normal levels of salts and minerals in your blood. This is often done by giving salts and minerals through a tube. The tube is passed through your nose and into your stomach. ? By treating the root cause. Follow these instructions at   home: Oral rehydration solution If told by your doctor, drink an ORS:  Make an ORS. Use instructions on the package.  Start by drinking small amounts, about  cup (120 mL) every 5-10 minutes.  Slowly drink more until you have had the amount that your doctor said to have. Eating and drinking         Drink enough clear fluid to keep your pee pale yellow. If you were told to drink an ORS, finish the ORS first. Then, start slowly drinking other clear fluids. Drink fluids such as: ? Water. Do not drink only water. Doing that can make the salt (sodium) level in your body get too  low. ? Water from ice chips you suck on. ? Fruit juice that you have added water to (diluted). ? Low-calorie sports drinks.  Eat foods that have the right amounts of salts and minerals, such as: ? Bananas. ? Oranges. ? Potatoes. ? Tomatoes. ? Spinach.  Do not drink alcohol.  Avoid: ? Drinks that have a lot of sugar. These include:  High-calorie sports drinks.  Fruit juice that you did not add water to.  Soda.  Caffeine. ? Foods that are greasy or have a lot of fat or sugar. General instructions  Take over-the-counter and prescription medicines only as told by your doctor.  Do not take salt tablets. Doing that can make the salt level in your body get too high.  Return to your normal activities as told by your doctor. Ask your doctor what activities are safe for you.  Keep all follow-up visits as told by your doctor. This is important. Contact a doctor if:  You have pain in your belly (abdomen) and the pain: ? Gets worse. ? Stays in one place.  You have a rash.  You have a stiff neck.  You get angry or annoyed (irritable) more easily than normal.  You are more tired or have a harder time waking than normal.  You feel: ? Weak or dizzy. ? Very thirsty. Get help right away if you have:  Any symptoms of very bad dehydration.  Symptoms of vomiting, such as: ? You cannot eat or drink without vomiting. ? Your vomiting gets worse or does not go away. ? Your vomit has blood or green stuff in it.  Symptoms that get worse with treatment.  A fever.  A very bad headache.  Problems with peeing or pooping (having a bowel movement), such as: ? Watery poop that gets worse or does not go away. ? Blood in your poop (stool). This may cause poop to look black and tarry. ? Not peeing in 6-8 hours. ? Peeing only a small amount of very dark pee in 6-8 hours.  Trouble breathing. These symptoms may be an emergency. Do not wait to see if the symptoms will go away. Get  medical help right away. Call your local emergency services (911 in the [place] Do not drive yourself to the hospital. Summary  Dehydration is a condition in which there is not enough water or other fluids in the body. This happens when a person loses more fluids than he or she takes in.  Treatment for this condition depends on how bad it is. Treatment should be started right away. Do not wait until your condition gets very bad.  Drink enough clear fluid to keep your pee pale yellow. If you were told to drink an oral rehydration solution (ORS), finish the ORS first. Then, start slowly drinking other clear fluids.  Take over-the-counter and prescription medicines only as told by your doctor.  Get help right away if you have any symptoms of very bad dehydration. This information is not intended to replace advice given to you by your health care provider. Make sure you discuss any questions you have with your health care provider. Document Revised: 10/03/2018 Document Reviewed: 10/03/2018 Elsevier Patient Education  [place].  Morning Sickness  Morning sickness is when you feel sick to your stomach (nauseous) during pregnancy. You may feel sick to your stomach and throw up (vomit). You may feel sick in the morning, but you can feel this way at any time of day. Some women feel very sick to their stomach and cannot stop throwing up (hyperemesis gravidarum). Follow these instructions at home: Medicines  Take over-the-counter and prescription medicines only as told by your doctor. Do not take any medicines until you talk with your doctor about them first.  Taking multivitamins before getting pregnant can stop or lessen the harshness of morning sickness. Eating and drinking  Eat dry toast or crackers before getting out of bed.  Eat 5 or 6 small meals a day.  Eat dry and bland foods like rice and baked potatoes.  Do not eat greasy, fatty, or spicy foods.  Have someone cook for you  if the smell of food causes you to feel sick or throw up.  If you feel sick to your stomach after taking prenatal vitamins, take them at night or with a snack.  Eat protein when you need a snack. Nuts, yogurt, and cheese are good choices.  Drink fluids throughout the day.  Try ginger ale made with real ginger, ginger tea made from fresh grated ginger, or ginger candies. General instructions  Do not use any products that have nicotine or tobacco in them, such as cigarettes and e-cigarettes. If you need help quitting, ask your doctor.  Use an air purifier to keep the air in your house free of smells.  Get lots of fresh air.  Try to avoid smells that make you feel sick.  Try: ? Wearing a bracelet that is used for seasickness (acupressure wristband). ? Going to a doctor who puts thin needles into certain body points (acupuncture) to improve how you feel. Contact a doctor if:  You need medicine to feel better.  You feel dizzy or light-headed.  You are losing weight. Get help right away if:  You feel very sick to your stomach and cannot stop throwing up.  You pass out (faint).  You have very bad pain in your belly. Summary  Morning sickness is when you feel sick to your stomach (nauseous) during pregnancy.  You may feel sick in the morning, but you can feel this way at any time of day.  Making some changes to what you eat may help your symptoms go away. This information is not intended to replace advice given to you by your health care provider. Make sure you discuss any questions you have with your health care provider. Document Revised: 02/02/2017 Document Reviewed: 03/23/2016 Elsevier Patient Education  2020 [place].  Kidney Stones Kidney stones are rock-like masses that form inside of the kidneys. Kidneys are organs that make pee (urine). A kidney stone may move into other parts of the urinary tract, including:  The tubes that connect the kidneys to the bladder  (ureters).  The bladder.  The tube that carries urine out of the body (urethra). Kidney stones can cause very bad pain and  can block the flow of pee. The stone usually leaves your body (passes) through your pee. You may need to have a doctor take out the stone. What are the causes? Kidney stones may be caused by:  A condition in which certain glands make too much parathyroid hormone (primary hyperparathyroidism).  A buildup of a type of crystals in the bladder made of a chemical called uric acid. The body makes uric acid when you eat certain foods.  Narrowing (stricture) of one or both of the ureters.  A kidney blockage that you were born with.  Past surgery on the kidney or the ureters, such as gastric bypass surgery. What increases the risk? You are more likely to develop this condition if:  You have had a kidney stone in the past.  You have a family history of kidney stones.  You do not drink enough water.  You eat a diet that is high in protein, salt (sodium), or sugar.  You are overweight or very overweight (obese). What are the signs or symptoms? Symptoms of a kidney stone may include:  Pain in the side of the belly, right below the ribs (flank pain). Pain usually spreads (radiates) to the groin.  Needing to pee often or right away (urgently).  Pain when going pee (urinating).  Blood in your pee (hematuria).  Feeling like you may vomit (nauseous).  Vomiting.  Fever and chills. How is this treated? Treatment depends on the size, location, and makeup of the kidney stones. The stones will often pass out of the body through peeing. You may need to:  Drink more fluid to help pass the stone. In some cases, you may be given fluids through an IV tube put into one of your veins at the hospital.  Take medicine for pain.  Make changes in your diet to help keep kidney stones from coming back. Sometimes, medical procedures are needed to remove a kidney stone. This may  involve:  A procedure to break up kidney stones using a beam of light (laser) or shock waves.  Surgery to remove the kidney stones. Follow these instructions at home: Medicines  Take over-the-counter and prescription medicines only as told by your doctor.  Ask your doctor if the medicine prescribed to you requires you to avoid driving or using heavy machinery. Eating and drinking  Drink enough fluid to keep your pee pale yellow. You may be told to drink at least 8-10 glasses of water each day. This will help you pass the stone.  If told by your doctor, change your diet. This may include: ? Limiting how much salt you eat. ? Eating more fruits and vegetables. ? Limiting how much meat, poultry, fish, and eggs you eat.  Follow instructions from your doctor about eating or drinking restrictions. General instructions  Collect pee samples as told by your doctor. You may need to collect a pee sample: ? 24 hours after a stone comes out. ? 8-12 weeks after a stone comes out, and every 6-12 months after that.  Strain your pee every time you pee (urinate), for as long as told. Use the strainer that your doctor recommends.  Do not throw out the stone. Keep it so that it can be tested by your doctor.  Keep all follow-up visits as told by your doctor. This is important. You may need follow-up tests. How is this prevented? To prevent another kidney stone:  Drink enough fluid to keep your pee pale yellow. This is the best way to  prevent kidney stones.  Eat healthy foods.  Avoid certain foods as told by your doctor. You may be told to eat less protein.  Stay at a healthy weight. Where to find more information  [place] ([facility]): www.kidney.[facility]): www.urologyhealth.org Contact a doctor if:  You have pain that gets worse or does not get better with medicine. Get help right away if:  You have a fever or chills.  You get very bad  pain.  You get new pain in your belly (abdomen).  You pass out (faint).  You cannot pee. Summary  Kidney stones are rock-like masses that form inside of the kidneys.  Kidney stones can cause very bad pain and can block the flow of pee.  The stones will often pass out of the body through peeing.  Drink enough fluid to keep your pee pale yellow. This information is not intended to replace advice given to you by your health care provider. Make sure you discuss any questions you have with your health care provider. Document Revised: 07/09/2018 Document Reviewed: 07/09/2018 Elsevier Patient Education  [place].

## 2020-01-09 NOTE — Progress Notes (Signed)
Written and verbal d/c instructions given and understanding voiced.To call office today for follow up plans.

## 2020-01-09 NOTE — MAU Note (Addendum)
L flank pain starting Weds. Some vomiting. Pain went away and came back Weds night. Thurs had more pain with n/v and this am pain is not going away. Pt is anxious and not sleeping well. Was taking Promethazine for n/v and did not help. Started Reglan and that is when flank pain started. Neither med is helping n/v. No diarrhea. No VB

## 2020-01-09 NOTE — MAU Provider Note (Signed)
Chief Complaint: Flank Pain and Emesis During Pregnancy   First Provider Initiated Contact with Patient 01/09/20 0207        SUBJECTIVE HPI: Caroline Yang is a 38 y.o. [id number] at [redacted]w[redacted]d by LMP who presents to maternity admissions reporting left flank pain and vomiting.  Husband states she never drinks anything at home.  Pain comes and goes.  Has used Phenergan and Reglan for vomiting at home and neither has helped. . She denies vaginal bleeding, vaginal itching/burning, urinary symptoms, h/a, dizziness, or fever/chills.    Gets care at [facility]   Did not call them. States has had Ultrasound there and baby was fine  Flank Pain This is a new problem. The current episode started in the past 7 days. The problem occurs intermittently. The problem has been waxing and waning since onset. The quality of the pain is described as cramping and aching. The pain does not radiate. Stiffness is present all day. Pertinent negatives include no abdominal pain, dysuria, fever, headaches, numbness or pelvic pain. She has tried nothing for the symptoms.  Emesis  This is a recurrent problem. The current episode started in the past 7 days. The problem occurs intermittently. The problem has been unchanged. There has been no fever. Pertinent negatives include no abdominal pain, chills, diarrhea, fever or headaches. Treatments tried: Reglan, Phenergan. The treatment provided no relief.   RN Note: L flank pain starting Weds. Some vomiting. Pain went away and came back Weds night. Thurs had more pain with n/v and this am pain is not going away. Pt is anxious and not sleeping well. Was taking Promethazine for n/v and did not help. Started Reglan and that is when flank pain started. Neither med is helping n/v. No diarrhea. No VB  Past Medical History:  Diagnosis Date  . GERD (gastroesophageal reflux disease)    occasionally and if needs meds uses OTC   Past Surgical History:  Procedure Laterality Date  .  CESAREAN SECTION N/A 09/08/2014   Procedure: CESAREAN SECTION;  Surgeon: Everett Graff, MD;  Location: [facility];  Service: Obstetrics;  Laterality: N/A;  . CHOLECYSTECTOMY N/A 05/04/2015   Procedure: LAPAROSCOPIC CHOLECYSTECTOMY WITH INTRAOPERATIVE CHOLANGIOGRAM;  Surgeon: Stark Klein, MD;  Location: [place];  Service: General;  Laterality: N/A;  . DILATION AND EVACUATION N/A 11/28/2012   Procedure: DILATATION AND EVACUATION, WITH INTRA-OPERATIVE ULTRSOUND;  Surgeon: Lavonia Drafts, MD;  Location: [facility];  Service: Gynecology;  Laterality: N/A;  . WISDOM TOOTH EXTRACTION     Social History   Socioeconomic History  . Marital status: Married    Spouse name: Not on file  . Number of children: Not on file  . Years of education: Not on file  . Highest education level: Not on file  Occupational History  . Not on file  Tobacco Use  . Smoking status: Never Smoker  . Smokeless tobacco: Never Used  Substance and Sexual Activity  . Alcohol use: No  . Drug use: No  . Sexual activity: Yes  Other Topics Concern  . Not on file  Social History Narrative  . Not on file   Social Determinants of Health   Financial Resource Strain:   . Difficulty of Paying Living Expenses: Not on file  Food Insecurity:   . Worried About Charity fundraiser in the Last Year: Not on file  . Ran Out of Food in the Last Year: Not on file  Transportation Needs:   . Lack of Transportation (Medical): Not  on file  . Lack of Transportation (Non-Medical): Not on file  Physical Activity:   . Days of Exercise per Week: Not on file  . Minutes of Exercise per Session: Not on file  Stress:   . Feeling of Stress : Not on file  Social Connections:   . Frequency of Communication with Friends and Family: Not on file  . Frequency of Social Gatherings with Friends and Family: Not on file  . Attends Religious Services: Not on file  . Active Member of Clubs or Organizations: Not on file  . Attends Archivist  Meetings: Not on file  . Marital Status: Not on file  Intimate Partner Violence:   . Fear of Current or Ex-Partner: Not on file  . Emotionally Abused: Not on file  . Physically Abused: Not on file  . Sexually Abused: Not on file   No current facility-administered medications on file prior to encounter.   Current Outpatient Medications on File Prior to Encounter  Medication Sig Dispense Refill  . diclofenac (VOLTAREN) 75 MG EC tablet Take 1 tablet (75 mg total) by mouth 2 (two) times daily. 30 tablet 0  . methocarbamol (ROBAXIN) 500 MG tablet Take 1 tablet (500 mg total) by mouth 4 (four) times daily. 40 tablet 0  . oxyCODONE-acetaminophen (ROXICET) 5-325 MG tablet Take 1-2 tablets by mouth every 4 (four) hours as needed for severe pain. 30 tablet 0  . predniSONE (DELTASONE) 20 MG tablet Take 3 daily for 2 days, then 2 daily for 2 days, then 1 daily for 2 days.  Take after breakfast. 12 tablet 0   Allergies  Allergen Reactions  . Penicillins Itching    I have reviewed patient's Past Medical Hx, Surgical Hx, Family Hx, Social Hx, medications and allergies.   ROS:  Review of Systems  Constitutional: Negative for chills and fever.  Gastrointestinal: Positive for vomiting. Negative for abdominal pain and diarrhea.  Genitourinary: Positive for flank pain. Negative for dysuria and pelvic pain.  Neurological: Negative for numbness and headaches.   Review of Systems  Other systems negative   Physical Exam  Physical Exam Patient Vitals for the past 24 hrs:  BP Temp Pulse Resp SpO2 Height Weight  01/09/20 0144 137/88 98.3 F (36.8 C) 86 20 100 % 5\' 2"  (1.575 m) 93.9 kg   Constitutional: Well-developed, well-nourished female in no acute distress.  Cardiovascular: normal rate Respiratory: normal effort GI: Abd soft, non-tender. Pos BS x 4 MS: Extremities nontender, no edema, normal ROM Neurologic: Alert and oriented x 4.  GU: Neg CVAT.  PELVIC EXAM: Deferred  LAB  RESULTS Results for orders placed or performed during the hospital encounter of 01/09/20 (from the past 24 hour(s))  Urinalysis, Routine w reflex microscopic Urine, Clean Catch     Status: Abnormal   Collection Time: 01/09/20  2:01 AM  Result Value Ref Range   Color, Urine AMBER (A) YELLOW   APPearance CLOUDY (A) CLEAR   Specific Gravity, Urine 1.023 1.005 - 1.030   pH 5.0 5.0 - 8.0   Glucose, UA 50 (A) NEGATIVE mg/dL   Hgb urine dipstick LARGE (A) NEGATIVE   Bilirubin Urine NEGATIVE NEGATIVE   Ketones, ur 20 (A) NEGATIVE mg/dL   Protein, ur 100 (A) NEGATIVE mg/dL   Nitrite NEGATIVE NEGATIVE   Leukocytes,Ua NEGATIVE NEGATIVE   RBC / HPF >50 (H) 0 - 5 RBC/hpf   WBC, UA 0-5 0 - 5 WBC/hpf   Bacteria, UA NONE SEEN NONE SEEN  Squamous Epithelial / LPF 0-5 0 - 5   Mucus PRESENT    Ca Oxalate Crys, UA PRESENT    Non Squamous Epithelial 0-5 (A) NONE SEEN  Pregnancy, urine POC     Status: Abnormal   Collection Time: 01/09/20  2:04 AM  Result Value Ref Range   Preg Test, Ur POSITIVE (A) NEGATIVE  CBC     Status: Abnormal   Collection Time: 01/09/20  2:37 AM  Result Value Ref Range   WBC 7.8 4.0 - 10.5 K/uL   RBC 4.37 3.87 - 5.11 MIL/uL   Hemoglobin 11.2 (L) 12.0 - 15.0 g/dL   HCT 35.2 (L) 36 - 46 %   MCV 80.5 80.0 - 100.0 fL   MCH 25.6 (L) 26.0 - 34.0 pg   MCHC 31.8 30.0 - 36.0 g/dL   RDW 16.8 (H) 11.5 - 15.5 %   Platelets 370 150 - 400 K/uL   nRBC 0.0 0.0 - 0.2 %  Basic metabolic panel     Status: Abnormal   Collection Time: 01/09/20  2:37 AM  Result Value Ref Range   Sodium 136 135 - 145 mmol/L   Potassium 3.4 (L) 3.5 - 5.1 mmol/L   Chloride 102 98 - 111 mmol/L   CO2 22 22 - 32 mmol/L   Glucose, Bld 109 (H) 70 - 99 mg/dL   BUN 5 (L) 6 - 20 mg/dL   Creatinine, Ser 0.52 0.44 - 1.00 mg/dL   Calcium 9.5 8.9 - 10.3 mg/dL   GFR, Estimated >60 >60 mL/min   Anion gap 12 5 - 15     IMAGING US RENAL  Result Date: 01/09/2020 CLINICAL DATA:  Severe left flank pain with  hematuria since Wednesday, [redacted] weeks pregnant EXAM: RENAL / URINARY TRACT ULTRASOUND COMPLETE COMPARISON:  Abdominal ultrasound 02/02/2015 FINDINGS: Right Kidney: Renal measurements: 12.9 x 5.4 x 4.3 cm = volume: 154 mL. Echogenicity is within normal limits. No concerning renal mass or shadowing calculus. Mild pelviectasis without frank hydronephrosis. Left Kidney: Renal measurements: 14 x 5.1 x 6.6 cm = volume: 244 mL. Echogenicity is within normal limits. No concerning renal mass, or shadowing calculus. No significant urinary tract dilatation. Bladder: Largely decompressed at the time of imaging and therefore difficult to assessed by sonography. Other: None. IMPRESSION: 1. Mild right pelviectasis without frank hydronephrosis. 2. No shadowing calculus or left urinary tract dilatation. 3. Urinary bladder is decompressed at the time of imaging and poorly assessed by sonography. Electronically Signed   By: Lovena Le M.D.   On: 01/09/2020 03:08     MAU Management/MDM: Ordered IV hydration and Dilaudid for pain, which reduced it to "5" [place] and labs ordered UA shows hematuria with no leukocytes or microscopic bacteria  There was mucous and calcium oxalate crystals however US showed no hydronephrosis or obvious stone Consulted Dr Harolyn Rutherford with assessment and results.  She recommends trying FloMax. Will also give more IVF and another dose of Dilaudid.  Tried Levsin without relief. Will also give Zofran for nausea.  Felt better after these treatments Discussed importance of pushing fluids Use Zofran if needed, reviewed risks, but since other two meds did not help we may need to use it Percocet for pain Call office to arrange followup   ASSESSMENT Pregnancy at [redacted]w[redacted]d Left flank pain Urinary Calcium oxalate crystals and hematuria Nausea and vomiting Dehydration due to poor water intake  PLAN Discharge home Rx Percocet #20 prn pain Rx Zofran to use if other two meds do not work  Push PO fluids Call  office to arrange followup  Pt stable at time of discharge. Encouraged to return here if she develops worsening of symptoms, increase in pain, fever, or other concerning symptoms.    Hansel Feinstein CNM, MSN Certified Nurse-Midwife 01/09/2020  2:08 AM

## 2020-01-10 LAB — CULTURE, OB URINE: Culture: NO GROWTH

## 2020-02-14 ENCOUNTER — Encounter (HOSPITAL_COMMUNITY): Payer: Self-pay | Admitting: Obstetrics & Gynecology

## 2020-02-14 ENCOUNTER — Other Ambulatory Visit: Payer: Self-pay

## 2020-02-14 ENCOUNTER — Inpatient Hospital Stay (HOSPITAL_COMMUNITY)
Admission: AD | Admit: 2020-02-14 | Discharge: 2020-02-14 | Disposition: A | Discharge status: Home / Self Care | Payer: BLUE CROSS/BLUE SHIELD | Attending: Obstetrics & Gynecology | Admitting: Obstetrics & Gynecology

## 2020-02-14 DIAGNOSIS — Z880 Allergy status to penicillin: Secondary | ICD-10-CM | POA: Diagnosis not present

## 2020-02-14 DIAGNOSIS — O09521 Supervision of elderly multigravida, first trimester: Secondary | ICD-10-CM | POA: Insufficient documentation

## 2020-02-14 DIAGNOSIS — Z3A13 13 weeks gestation of pregnancy: Secondary | ICD-10-CM | POA: Insufficient documentation

## 2020-02-14 DIAGNOSIS — O26891 Other specified pregnancy related conditions, first trimester: Secondary | ICD-10-CM | POA: Insufficient documentation

## 2020-02-14 DIAGNOSIS — Z9049 Acquired absence of other specified parts of digestive tract: Secondary | ICD-10-CM | POA: Diagnosis not present

## 2020-02-14 DIAGNOSIS — R519 Headache, unspecified: Secondary | ICD-10-CM | POA: Diagnosis not present

## 2020-02-14 DIAGNOSIS — Z79899 Other long term (current) drug therapy: Secondary | ICD-10-CM | POA: Diagnosis not present

## 2020-02-14 DIAGNOSIS — O219 Vomiting of pregnancy, unspecified: Secondary | ICD-10-CM | POA: Insufficient documentation

## 2020-02-14 LAB — BASIC METABOLIC PANEL
Anion gap: 8 (ref 5–15)
BUN: 6 mg/dL (ref 6–20)
CO2: 23 mmol/L (ref 22–32)
Calcium: 9.5 mg/dL (ref 8.9–10.3)
Chloride: 103 mmol/L (ref 98–111)
Creatinine, Ser: 0.4 mg/dL — ABNORMAL LOW (ref 0.44–1.00)
GFR, Estimated: >60 mL/min (ref >60)
Glucose, Bld: 99 mg/dL (ref 70–99)
Potassium: 4 mmol/L (ref 3.5–5.1)
Sodium: 134 mmol/L — ABNORMAL LOW (ref 135–145)

## 2020-02-14 LAB — URINALYSIS, ROUTINE W REFLEX MICROSCOPIC
Bilirubin Urine: NEGATIVE
Glucose, UA: NEGATIVE mg/dL
Hgb urine dipstick: NEGATIVE
Ketones, ur: NEGATIVE mg/dL
Leukocytes,Ua: NEGATIVE
Nitrite: NEGATIVE
Protein, ur: NEGATIVE mg/dL
Specific Gravity, Urine: 1.019 (ref 1.005–1.030)
pH: 6 (ref 5.0–8.0)

## 2020-02-14 LAB — CBC
HCT: 32.4 % — ABNORMAL LOW (ref 36.0–46.0)
Hemoglobin: 10.7 g/dL — ABNORMAL LOW (ref 12.0–15.0)
MCH: 26.5 pg (ref 26.0–34.0)
MCHC: 33 g/dL (ref 30.0–36.0)
MCV: 80.2 fL (ref 80.0–100.0)
Platelets: 398 10*3/uL (ref 150–400)
RBC: 4.04 MIL/uL (ref 3.87–5.11)
RDW: 15.9 % — ABNORMAL HIGH (ref 11.5–15.5)
WBC: 6.4 10*3/uL (ref 4.0–10.5)
nRBC: 0 % (ref 0.0–0.2)

## 2020-02-14 MED ORDER — FAMOTIDINE 10 MG PO TABS: 10.0000 mg | ORAL_TABLET | Freq: Two times a day (BID) | ORAL | 0 refills | Status: DC

## 2020-02-14 MED ORDER — FAMOTIDINE IN NACL 20-0.9 MG/50ML-% IV SOLN
20.0000 mg | Freq: Once | INTRAVENOUS | Status: AC
  Administered 2020-02-14: 20 mg via INTRAVENOUS
  Filled 2020-02-14: qty 50

## 2020-02-14 MED ORDER — SODIUM CHLORIDE 0.9 % IV SOLN
25.0000 mg | Freq: Once | INTRAVENOUS | Status: AC
  Administered 2020-02-14: 25 mg via INTRAVENOUS
  Filled 2020-02-14: qty 1

## 2020-02-14 MED ORDER — PROMETHAZINE HCL 25 MG PO TABS: 25.0000 mg | ORAL_TABLET | Freq: Four times a day (QID) | ORAL | 1 refills | Status: DC | PRN

## 2020-02-14 NOTE — MAU Provider Note (Signed)
History     CSN: [id number]  Arrival date and time: 02/14/20 1713  Event Date/Time  First Provider Initiated Contact with Patient 02/14/20 1750      Chief Complaint  Patient presents with  . Nausea  . Emesis  . Headache   HPI Caroline Yang is a 39 y.o. [id number] at [redacted]w[redacted]d who presents to MAU with chief complaints of nausea, vomiting and headache. These are recurrent problems, onset in early first trimester. Patient states she has been unable tolerate anything PO besides water for about 24 hours. She endorses irregular use of Zofran. She denies having access to other antiemetics.  Patient's headache is anterior. Pain score is 3/10. She denies aggravating or alleviating factors. She has not taken medication or tried other treatments for this complaint.  Patient receives prenatal care with [facility] and her next appointment is 02/19/2020.  OB History    Gravida  5   Para  3   Term  3   Preterm      AB  1   Living  2     SAB  1   IAB      Ectopic      Multiple  0   Live Births  2           Past Medical History:  Diagnosis Date  . GERD (gastroesophageal reflux disease)    occasionally and if needs meds uses OTC    Past Surgical History:  Procedure Laterality Date  . CESAREAN SECTION N/A 09/08/2014   Procedure: CESAREAN SECTION;  Surgeon: Everett Graff, MD;  Location: [facility];  Service: Obstetrics;  Laterality: N/A;  . CHOLECYSTECTOMY N/A 05/04/2015   Procedure: LAPAROSCOPIC CHOLECYSTECTOMY WITH INTRAOPERATIVE CHOLANGIOGRAM;  Surgeon: Stark Klein, MD;  Location: [facility];  Service: General;  Laterality: N/A;  . DILATION AND EVACUATION N/A 11/28/2012   Procedure: DILATATION AND EVACUATION, WITH INTRA-OPERATIVE ULTRSOUND;  Surgeon: Lavonia Drafts, MD;  Location: [facility];  Service: Gynecology;  Laterality: N/A;  . WISDOM TOOTH EXTRACTION      Family History  Problem Relation Age of Onset  . Thyroid disease Son     Social History   Tobacco Use  .  Smoking status: Never Smoker  . Smokeless tobacco: Never Used  Vaping Use  . Vaping Use: Never used  Substance Use Topics  . Alcohol use: No  . Drug use: No    Allergies:  Allergies  Allergen Reactions  . Penicillins Itching    Medications Prior to Admission  Medication Sig Dispense Refill Last Dose  . ondansetron (ZOFRAN ODT) 4 MG disintegrating tablet Take 1 tablet (4 mg total) by mouth every 6 (six) hours as needed for nausea. 20 tablet 0 02/14/2020 at 1100  . metoCLOPramide (REGLAN) 10 MG tablet Take 10 mg by mouth 4 (four) times daily.     Marland Kitchen oxyCODONE-acetaminophen (PERCOCET/ROXICET) 5-325 MG tablet Take 1 tablet by mouth every 6 (six) hours as needed for severe pain. 20 tablet 0   . Prenatal Vit-Fe Fumarate-FA (PRENATAL MULTIVITAMIN) TABS tablet Take 1 tablet by mouth daily at 12 noon.     . promethazine (PHENERGAN) 25 MG tablet Take 25 mg by mouth every 6 (six) hours as needed for nausea or vomiting.       Review of Systems  Gastrointestinal: Positive for nausea and vomiting.  Neurological: Positive for headaches.  All other systems reviewed and are negative.  Physical Exam   Blood pressure 122/74, pulse 83, temperature 98 F (36.7 C), temperature source  Oral, resp. rate 19, weight 89.1 kg, last menstrual period 11/14/2019, SpO2 100 %.  Physical Exam Vitals and nursing note reviewed.  Cardiovascular:     Rate and Rhythm: Normal rate.     Heart sounds: Normal heart sounds.  Pulmonary:     Effort: Pulmonary effort is normal.     Breath sounds: Normal breath sounds.  Abdominal:     Palpations: Abdomen is soft.  Skin:    General: Skin is warm and dry.     Capillary Refill: Capillary refill takes less than 2 seconds.  Neurological:     Mental Status: She is alert and oriented to person, place, and time.  Psychiatric:        Mood and Affect: Mood normal.        Speech: Speech normal.        Behavior: Behavior normal.    MAU Course  Procedures  --Patient  evaluated for flank pain, nausea and vomiting in MAU on 01/09/2020. CNM notes from that visit indicate active prescriptions for Zofran and Phenergan. On initial assessment tonight patient and her husband deny use of Phenergan at any time.  --Patient endorses feeling better s/p medications in MAU.   --Tolerating ice chips and crackers prior to discharge. Patient encouraged to focus on bland simple carbs, try to identify 3-5 food she can consistently tolerate and rely on them for short-term.  Patient Vitals for the past 24 hrs:  BP Temp Temp src Pulse Resp SpO2 Weight  02/14/20 1956 (!) 100/49 98.2 F (36.8 C) Oral 78 17 100 % --  02/14/20 1750 -- -- -- -- -- -- 89.1 kg  02/14/20 1737 122/74 98 F (36.7 C) Oral 83 19 100 % --   Results for orders placed or performed during the hospital encounter of 02/14/20 (from the past 24 hour(s))  Urinalysis, Routine w reflex microscopic Urine, Clean Catch     Status: Abnormal   Collection Time: 02/14/20  5:37 PM  Result Value Ref Range   Color, Urine YELLOW YELLOW   APPearance HAZY (A) CLEAR   Specific Gravity, Urine 1.019 1.005 - 1.030   pH 6.0 5.0 - 8.0   Glucose, UA NEGATIVE NEGATIVE mg/dL   Hgb urine dipstick NEGATIVE NEGATIVE   Bilirubin Urine NEGATIVE NEGATIVE   Ketones, ur NEGATIVE NEGATIVE mg/dL   Protein, ur NEGATIVE NEGATIVE mg/dL   Nitrite NEGATIVE NEGATIVE   Leukocytes,Ua NEGATIVE NEGATIVE  CBC     Status: Abnormal   Collection Time: 02/14/20  6:11 PM  Result Value Ref Range   WBC 6.4 4.0 - 10.5 K/uL   RBC 4.04 3.87 - 5.11 MIL/uL   Hemoglobin 10.7 (L) 12.0 - 15.0 g/dL   HCT 32.4 (L) 36.0 - 46.0 %   MCV 80.2 80.0 - 100.0 fL   MCH 26.5 26.0 - 34.0 pg   MCHC 33.0 30.0 - 36.0 g/dL   RDW 15.9 (H) 11.5 - 15.5 %   Platelets 398 150 - 400 K/uL   nRBC 0.0 0.0 - 0.2 %  Basic metabolic panel     Status: Abnormal   Collection Time: 02/14/20  6:11 PM  Result Value Ref Range   Sodium 134 (L) 135 - 145 mmol/L   Potassium 4.0 3.5 - 5.1  mmol/L   Chloride 103 98 - 111 mmol/L   CO2 23 22 - 32 mmol/L   Glucose, Bld 99 70 - 99 mg/dL   BUN 6 6 - 20 mg/dL   Creatinine, Ser 0.40 (L) 0.44 -  1.00 mg/dL   Calcium 9.5 8.9 - 10.3 mg/dL   GFR, Estimated >60 >60 mL/min   Anion gap 8 5 - 15   Meds ordered this encounter  Medications  . promethazine (PHENERGAN) 25 mg in sodium chloride 0.9 % 1,000 mL infusion  . famotidine (PEPCID) IVPB 20 mg premix   Assessment and Plan  --39 y.o. [id number] at [redacted]w[redacted]d  --Nausea and vomiting in pregnancy --Tolerating PO prior to discharge --New regimen, bland diet, grazing/snacking encouraged --Discharge home in stable condition  F/U: --[facility]OB 02/19/2020  Darlina Rumpf, [place] 02/14/2020, 8:14 PM

## 2020-02-14 NOTE — MAU Note (Signed)
.   Caroline Yang is a 39 y.o. at [redacted]w[redacted]d here in MAU reporting: dizziness and blurry vision since yesterday. She reports that she has had nausea and vomiting throughout her pregnancy but has not been able to keep anything down for the past 24 hours. Has thrown up x8 since yesterday. No VB or abnormal discharge. She also reports a headache.   Pain score: 3 Vitals:   02/14/20 1737  BP: 122/74  Pulse: 83  Resp: 19  Temp: 98 F (36.7 C)  SpO2: 100%     FHT:158 Lab orders placed from triage: UA

## 2020-02-14 NOTE — Discharge Instructions (Signed)
Follow these instructions at home: Eating and drinking   Avoid the following: ? Drinking fluids with meals. Try not to drink anything during the 30 minutes before and after your meals. ? Drinking more than 1 cup of fluid at a time. ? Eating foods that trigger your symptoms. These may include spicy foods, coffee, high-fat foods, very sweet foods, and acidic foods. ? Skipping meals. Nausea can be more intense on an empty stomach. If you cannot tolerate food, do not force it. Try sucking on ice chips or other frozen items and make up for missed calories later. ? Lying down within 2 hours after eating. ? Being exposed to environmental triggers. These may include food smells, smoky rooms, closed spaces, rooms with strong smells, warm or humid places, overly loud and noisy rooms, and rooms with motion or flickering lights. Try eating meals in a well-ventilated area that is free of strong smells. ? Quick and sudden changes in your movement. ? Taking iron pills and multivitamins that contain iron. If you take prescription iron pills, do not stop taking them unless your health care provider approves. ? Preparing food. The smell of food can spoil your appetite or trigger nausea.  To help relieve your symptoms: ? Listen to your body. Everyone is different and has different preferences. Find what works best for you. ? Eat and drink slowly. ? Eat 5-6 small meals daily instead of 3 large meals. Eating small meals and snacks can help you avoid an empty stomach. ? In the morning, before getting out of bed, eat a couple of crackers to avoid moving around on an empty stomach. ? Try eating starchy foods as these are usually tolerated well. Examples include cereal, toast, bread, potatoes, pasta, rice, and pretzels. ? Include at least 1 serving of protein with your meals and snacks. Protein options include lean meats, poultry, seafood, beans, nuts, nut butters, eggs, cheese, and yogurt. ? Try eating a protein-rich  snack before bed. Examples of a protein-rick snack include cheese and crackers or a peanut butter sandwich made with 1 slice of whole-wheat bread and 1 tsp (5 g) of peanut butter. ? Eat or suck on things that have ginger in them. It may help relieve nausea. Add  tsp ground ginger to hot tea or choose ginger tea. ? Try drinking 100% fruit juice or an electrolyte drink. An electrolyte drink contains sodium, potassium, and chloride. ? Drink fluids that are cold, clear, and carbonated or sour. Examples include lemonade, ginger ale, lemon-lime soda, ice water, and sparkling water. ? Brush your teeth or use a mouth rinse after meals. ? Talk with your health care provider about starting a supplement of vitamin B6. General instructions  Take over-the-counter and prescription medicines only as told by your health care provider.  Follow instructions from your health care provider about eating or drinking restrictions.  Continue to take your prenatal vitamins as told by your health care provider. If you are having trouble taking your prenatal vitamins, talk with your health care provider about different options.  Keep all follow-up and pre-birth (prenatal) visits as told by your health care provider. This is important. Contact a health care provider if:  You have pain in your abdomen.  You have a severe headache.  You have vision problems.  You are losing weight.  You feel weak or dizzy. Get help right away if:  You cannot drink fluids without vomiting.  You vomit blood.  You have constant nausea and vomiting.  You are  very weak.  You faint.  You have a fever and your symptoms suddenly get worse. Summary  Making some changes to your eating habits may help relieve nausea and vomiting.  This condition may be managed with medicine.  If medicines do not help relieve nausea and vomiting, you may need to receive fluids through an IV at the hospital. This information is not intended to  replace advice given to you by your health care provider. Make sure you discuss any questions you have with your health care provider. Document Revised: 03/12/2017 Document Reviewed: 10/20/2015 Elsevier Patient Education  [employer].

## 2020-04-19 ENCOUNTER — Other Ambulatory Visit: Payer: Self-pay

## 2020-04-19 ENCOUNTER — Inpatient Hospital Stay (HOSPITAL_COMMUNITY)
Admission: AD | Admit: 2020-04-19 | Discharge: 2020-04-19 | Disposition: A | Discharge status: Home / Self Care | Payer: Medicaid Other | Attending: Obstetrics and Gynecology | Admitting: Obstetrics and Gynecology

## 2020-04-19 ENCOUNTER — Encounter (HOSPITAL_COMMUNITY): Payer: Self-pay | Admitting: Obstetrics and Gynecology

## 2020-04-19 DIAGNOSIS — O99891 Other specified diseases and conditions complicating pregnancy: Secondary | ICD-10-CM | POA: Diagnosis not present

## 2020-04-19 DIAGNOSIS — Z880 Allergy status to penicillin: Secondary | ICD-10-CM | POA: Insufficient documentation

## 2020-04-19 DIAGNOSIS — O26892 Other specified pregnancy related conditions, second trimester: Secondary | ICD-10-CM | POA: Diagnosis not present

## 2020-04-19 DIAGNOSIS — Z3A22 22 weeks gestation of pregnancy: Secondary | ICD-10-CM

## 2020-04-19 DIAGNOSIS — R42 Dizziness and giddiness: Secondary | ICD-10-CM

## 2020-04-19 DIAGNOSIS — R519 Headache, unspecified: Secondary | ICD-10-CM | POA: Insufficient documentation

## 2020-04-19 DIAGNOSIS — Z3492 Encounter for supervision of normal pregnancy, unspecified, second trimester: Secondary | ICD-10-CM

## 2020-04-19 LAB — CBC
HCT: 33.1 % — ABNORMAL LOW (ref 36.0–46.0)
Hemoglobin: 10.3 g/dL — ABNORMAL LOW (ref 12.0–15.0)
MCH: 25.2 pg — ABNORMAL LOW (ref 26.0–34.0)
MCHC: 31.1 g/dL (ref 30.0–36.0)
MCV: 81.1 fL (ref 80.0–100.0)
Platelets: 381 10*3/uL (ref 150–400)
RBC: 4.08 MIL/uL (ref 3.87–5.11)
RDW: 14.6 % (ref 11.5–15.5)
WBC: 7.9 10*3/uL (ref 4.0–10.5)
nRBC: 0 % (ref 0.0–0.2)

## 2020-04-19 LAB — URINALYSIS, ROUTINE W REFLEX MICROSCOPIC
Bilirubin Urine: NEGATIVE
Glucose, UA: NEGATIVE mg/dL
Ketones, ur: NEGATIVE mg/dL
Nitrite: NEGATIVE
Protein, ur: 30 mg/dL — AB
Specific Gravity, Urine: 1.021 (ref 1.005–1.030)
pH: 5 (ref 5.0–8.0)

## 2020-04-19 LAB — COMPREHENSIVE METABOLIC PANEL
ALT: 11 U/L (ref 0–44)
AST: 16 U/L (ref 15–41)
Albumin: 3.1 g/dL — ABNORMAL LOW (ref 3.5–5.0)
Alkaline Phosphatase: 61 U/L (ref 38–126)
Anion gap: 11 (ref 5–15)
BUN: <5 mg/dL — ABNORMAL LOW (ref 6–20)
CO2: 21 mmol/L — ABNORMAL LOW (ref 22–32)
Calcium: 9.6 mg/dL (ref 8.9–10.3)
Chloride: 103 mmol/L (ref 98–111)
Creatinine, Ser: 0.38 mg/dL — ABNORMAL LOW (ref 0.44–1.00)
GFR, Estimated: >60 mL/min (ref >60)
Glucose, Bld: 114 mg/dL — ABNORMAL HIGH (ref 70–99)
Potassium: 3.6 mmol/L (ref 3.5–5.1)
Sodium: 135 mmol/L (ref 135–145)
Total Bilirubin: 0.5 mg/dL (ref 0.3–1.2)
Total Protein: 7 g/dL (ref 6.5–8.1)

## 2020-04-19 LAB — TSH: TSH: 0.709 u[IU]/mL (ref 0.350–4.500)

## 2020-04-19 MED ORDER — LACTATED RINGERS IV BOLUS
1000.0000 mL | Freq: Once | INTRAVENOUS | Status: AC
  Administered 2020-04-19: 1000 mL via INTRAVENOUS

## 2020-04-19 MED ORDER — ACETAMINOPHEN 500 MG PO TABS
1000.0000 mg | ORAL_TABLET | Freq: Once | ORAL | Status: AC
  Administered 2020-04-19: 1000 mg via ORAL
  Filled 2020-04-19: qty 2

## 2020-04-19 NOTE — Discharge Instructions (Signed)

## 2020-04-19 NOTE — MAU Note (Signed)
Presents with c/o rapid heart rate, dizziness, blurred vision, and H/A.  States took Tylenol this afternoon for H/A some relief noted.  Denies VB or LOF.  Endorses +FM.

## 2020-04-19 NOTE — MAU Provider Note (Cosign Needed Addendum)
History     CSN: [id number]  Arrival date and time: 04/19/20 1841   Event Date/Time   First Provider Initiated Contact with Patient 04/19/20 2013      Chief Complaint  Patient presents with  . Rapid Heart rate  . Headache  . Dizziness   HPI Caroline Yang is a 40 y.o. Y1P5093 at [redacted]w[redacted]d who presents to MAU with chief complaints of maternal tachycardia, headache and dizziness. Patient states whenever she stands up to perform household chores she experiences a racing heartbeat and feels as if she needs to sit down. She has not taken her vital signs at home. These are recurrent problems, onset in November 2021. She denies weakness, syncope, abdominal pain, dysuria.   Patient also c/o headache, recurrent but mild and unpredictable. She denies pain on CNM initial assessment.   Patient endorses regular nutrition and denies problems related ot food intolerance. She consumes 4-5 8 ounce bottles of water per day.  Patient receives care with [facility].  OB History    Gravida  5   Para  3   Term  3   Preterm      AB  1   Living  2     SAB  1   IAB      Ectopic      Multiple  0   Live Births  2           Past Medical History:  Diagnosis Date  . GERD (gastroesophageal reflux disease)    occasionally and if needs meds uses OTC    Past Surgical History:  Procedure Laterality Date  . CESAREAN SECTION N/A 09/08/2014   Procedure: CESAREAN SECTION;  Surgeon: Everett Graff, MD;  Location: [facility];  Service: Obstetrics;  Laterality: N/A;  . CHOLECYSTECTOMY N/A 05/04/2015   Procedure: LAPAROSCOPIC CHOLECYSTECTOMY WITH INTRAOPERATIVE CHOLANGIOGRAM;  Surgeon: Stark Klein, MD;  Location: [facility];  Service: General;  Laterality: N/A;  . DILATION AND EVACUATION N/A 11/28/2012   Procedure: DILATATION AND EVACUATION, WITH INTRA-OPERATIVE ULTRSOUND;  Surgeon: Lavonia Drafts, MD;  Location: [facility];  Service: Gynecology;  Laterality: N/A;  . WISDOM TOOTH EXTRACTION      Family  History  Problem Relation Age of Onset  . Thyroid disease Son     Social History   Tobacco Use  . Smoking status: Never Smoker  . Smokeless tobacco: Never Used  Vaping Use  . Vaping Use: Never used  Substance Use Topics  . Alcohol use: No  . Drug use: No    Allergies:  Allergies  Allergen Reactions  . Penicillins Itching    Medications Prior to Admission  Medication Sig Dispense Refill Last Dose  . ondansetron (ZOFRAN ODT) 4 MG disintegrating tablet Take 1 tablet (4 mg total) by mouth every 6 (six) hours as needed for nausea. 20 tablet 0 04/19/2020 at Unknown time  . Prenatal Vit-Fe Fumarate-FA (PRENATAL MULTIVITAMIN) TABS tablet Take 1 tablet by mouth daily at 12 noon.   04/19/2020 at Unknown time  . famotidine (PEPCID) 10 MG tablet Take 1 tablet (10 mg total) by mouth 2 (two) times daily. 60 tablet 0   . promethazine (PHENERGAN) 25 MG tablet Take 1 tablet (25 mg total) by mouth every 6 (six) hours as needed for nausea or vomiting. 30 tablet 1     Review of Systems  Constitutional: Positive for fatigue.  Neurological: Positive for dizziness.  All other systems reviewed and are negative.  Physical Exam   Blood pressure 123/70, pulse  92, temperature 98.1 F (36.7 C), temperature source Oral, resp. rate 18, height 5\' 3"  (1.6 m), weight 89.3 kg, last menstrual period 11/14/2019, SpO2 100 %.  Physical Exam Vitals and nursing note reviewed.  Constitutional:      Appearance: She is not ill-appearing.  Cardiovascular:     Heart sounds: Normal heart sounds.  Pulmonary:     Effort: Pulmonary effort is normal.     Breath sounds: Normal breath sounds.  Abdominal:     General: Bowel sounds are normal.     Palpations: Abdomen is soft.  Skin:    Capillary Refill: Capillary refill takes less than 2 seconds.  Neurological:     Mental Status: She is alert and oriented to person, place, and time.  Psychiatric:        Mood and Affect: Mood normal.        Speech: Speech normal.         Behavior: Behavior normal.     MAU Course  Procedures  Results for orders placed or performed during the hospital encounter of 04/19/20 (from the past 24 hour(s))  Urinalysis, Routine w reflex microscopic Urine, Clean Catch     Status: Abnormal   Collection Time: 04/19/20  7:37 PM  Result Value Ref Range   Color, Urine AMBER (A) YELLOW   APPearance CLOUDY (A) CLEAR   Specific Gravity, Urine 1.021 1.005 - 1.030   pH 5.0 5.0 - 8.0   Glucose, UA NEGATIVE NEGATIVE mg/dL   Hgb urine dipstick SMALL (A) NEGATIVE   Bilirubin Urine NEGATIVE NEGATIVE   Ketones, ur NEGATIVE NEGATIVE mg/dL   Protein, ur 30 (A) NEGATIVE mg/dL   Nitrite NEGATIVE NEGATIVE   Leukocytes,Ua SMALL (A) NEGATIVE   RBC / HPF 6-10 0 - 5 RBC/hpf   WBC, UA 11-20 0 - 5 WBC/hpf   Bacteria, UA MANY (A) NONE SEEN   Squamous Epithelial / LPF 6-10 0 - 5   Mucus PRESENT   CBC     Status: Abnormal   Collection Time: 04/19/20  8:51 PM  Result Value Ref Range   WBC 7.9 4.0 - 10.5 K/uL   RBC 4.08 3.87 - 5.11 MIL/uL   Hemoglobin 10.3 (L) 12.0 - 15.0 g/dL   HCT 33.1 (L) 36.0 - 46.0 %   MCV 81.1 80.0 - 100.0 fL   MCH 25.2 (L) 26.0 - 34.0 pg   MCHC 31.1 30.0 - 36.0 g/dL   RDW 14.6 11.5 - 15.5 %   Platelets 381 150 - 400 K/uL   nRBC 0.0 0.0 - 0.2 %  Comprehensive metabolic panel     Status: Abnormal   Collection Time: 04/19/20  8:51 PM  Result Value Ref Range   Sodium 135 135 - 145 mmol/L   Potassium 3.6 3.5 - 5.1 mmol/L   Chloride 103 98 - 111 mmol/L   CO2 21 (L) 22 - 32 mmol/L   Glucose, Bld 114 (H) 70 - 99 mg/dL   BUN <5 (L) 6 - 20 mg/dL   Creatinine, Ser 0.38 (L) 0.44 - 1.00 mg/dL   Calcium 9.6 8.9 - 10.3 mg/dL   Total Protein 7.0 6.5 - 8.1 g/dL   Albumin 3.1 (L) 3.5 - 5.0 g/dL   AST 16 15 - 41 U/L   ALT 11 0 - 44 U/L   Alkaline Phosphatase 61 38 - 126 U/L   Total Bilirubin 0.5 0.3 - 1.2 mg/dL   GFR, Estimated >60 >60 mL/min   Anion gap 11 5 - 15  TSH     Status: None   Collection Time: 04/19/20  8:51 PM   Result Value Ref Range   TSH 0.709 0.350 - 4.500 uIU/mL    --Discussed with patient that IV fluids are not strictly indicated based on UA and vital signs but fluid bolus might help her feel better. Patient and husband agreeable.  Orders Placed This Encounter  Procedures  . Culture, OB Urine  . Urinalysis, Routine w reflex microscopic Urine, Clean Catch  . CBC  . Comprehensive metabolic panel  . TSH  . EKG 12-Lead   Patient Vitals for the past 24 hrs:  BP Temp Temp src Pulse Resp SpO2 Height Weight  04/19/20 2010 - - - - - 100 % - -  04/19/20 2005 - - - - - 100 % - -  04/19/20 2000 - - - - - 99 % - -  04/19/20 1955 - - - - - 100 % - -  04/19/20 1950 - - - - - 100 % - -  04/19/20 1945 - - - - - 100 % - -  04/19/20 1943 123/70 - - 92 18 100 % - -  04/19/20 1924 119/67 98.1 F (36.7 C) Oral 93 20 100 % - -  04/19/20 1918 - - - - - - 5\' 3"  (1.6 m) 89.3 kg   Report given to E. Purcell Nails, NP who assumes care of patient at this time  Mallie Snooks, MSN, CNM Certified Nurse Midwife, [facility] for [employer], [place] 04/19/20 10:06 PM    Patient reports improvement in symptoms with IV fluids. Vital signs stable. EKG normal. Labs normal.   U/a consistent with UTI, but patient without urinary complaints. Will send for urine culture & treat accordingly.  Presenting complaint has been an ongoing issue since November. States she hasn't spoken with her ob/gyn about her symptoms. Discussed proper hydration and food intake with patient & her spouse. Recommend her speaking with her ob/gyn for further evaluation if symptoms worsen.  Assessment and Plan   1. Dizziness   2. Fetal heart tones present, second trimester   3. [redacted] weeks gestation of pregnancy      Jorje Guild, NP

## 2020-04-21 LAB — CULTURE, OB URINE: Culture: <10000 — AB

## 2020-07-07 ENCOUNTER — Ambulatory Visit: Payer: Medicaid Other

## 2020-07-21 ENCOUNTER — Encounter: Payer: Medicaid Other | Attending: Obstetrics and Gynecology | Admitting: Registered"

## 2020-07-22 ENCOUNTER — Other Ambulatory Visit: Payer: Self-pay | Admitting: Obstetrics & Gynecology

## 2020-08-05 ENCOUNTER — Other Ambulatory Visit: Payer: Medicaid Other

## 2020-08-06 NOTE — Patient Instructions (Signed)
Caroline Yang  08/06/2020   Your procedure is scheduled on:  08/15/2020  Arrive at 0900 at Entrance C on [address] [facility]  and [employer]. You are invited to use the FREE valet parking or use the Visitor's parking deck.  Pick up the phone at the desk and dial [phone].  Call this number if you have problems the morning of surgery: [phone]  Remember:   Do not eat food:(After Midnight) Desps de medianoche.  Do not drink clear liquids: (After Midnight) Desps de medianoche.  Take these medicines the morning of surgery with A SIP OF WATER:  none   Do not wear jewelry, make-up or nail polish.  Do not wear lotions, powders, or perfumes. Do not wear deodorant.  Do not shave 48 hours prior to surgery.  Do not bring valuables to the hospital.  [facility] is not   responsible for any belongings or valuables brought to the hospital.  Contacts, dentures or bridgework may not be worn into surgery.  Leave suitcase in the car. After surgery it may be brought to your room.  For patients admitted to the hospital, checkout time is 11:00 AM the day of              discharge.      Please read over the following fact sheets that you were given:     Preparing for Surgery

## 2020-08-09 ENCOUNTER — Telehealth (HOSPITAL_COMMUNITY): Payer: Self-pay | Admitting: *Deleted

## 2020-08-09 NOTE — Telephone Encounter (Signed)
Preadmission screen  

## 2020-08-09 NOTE — Pre-Procedure Instructions (Signed)
Interpreter number [phone]

## 2020-08-10 ENCOUNTER — Telehealth (HOSPITAL_COMMUNITY): Payer: Self-pay | Admitting: *Deleted

## 2020-08-10 NOTE — Telephone Encounter (Signed)
Preadmission screen  

## 2020-08-11 ENCOUNTER — Encounter (HOSPITAL_COMMUNITY): Payer: Self-pay

## 2020-08-11 NOTE — Pre-Procedure Instructions (Signed)
Interpreter number [phone]

## 2020-08-11 NOTE — Pre-Procedure Instructions (Signed)
Interpreter number [phone]

## 2020-08-13 ENCOUNTER — Encounter (HOSPITAL_COMMUNITY)
Admission: RE | Admit: 2020-08-13 | Discharge: 2020-08-13 | Disposition: A | Discharge status: Home / Self Care | Payer: Medicaid Other | Source: Ambulatory Visit | Attending: Obstetrics & Gynecology | Admitting: Obstetrics & Gynecology

## 2020-08-13 ENCOUNTER — Other Ambulatory Visit (HOSPITAL_COMMUNITY)
Admission: RE | Admit: 2020-08-13 | Discharge: 2020-08-13 | Disposition: A | Discharge status: Home / Self Care | Payer: Medicaid Other | Source: Ambulatory Visit | Attending: Obstetrics & Gynecology | Admitting: Obstetrics & Gynecology

## 2020-08-13 ENCOUNTER — Other Ambulatory Visit: Payer: Self-pay

## 2020-08-13 DIAGNOSIS — Z20822 Contact with and (suspected) exposure to covid-19: Secondary | ICD-10-CM | POA: Diagnosis not present

## 2020-08-13 DIAGNOSIS — Z01812 Encounter for preprocedural laboratory examination: Secondary | ICD-10-CM | POA: Diagnosis present

## 2020-08-13 HISTORY — DX: Other complications of anesthesia, initial encounter: T88.59XA

## 2020-08-13 HISTORY — DX: Gestational diabetes mellitus in pregnancy, unspecified control: O24.419

## 2020-08-13 LAB — BASIC METABOLIC PANEL
Anion gap: 10 (ref 5–15)
BUN: 9 mg/dL (ref 6–20)
CO2: 22 mmol/L (ref 22–32)
Calcium: 9 mg/dL (ref 8.9–10.3)
Chloride: 104 mmol/L (ref 98–111)
Creatinine, Ser: 0.35 mg/dL — ABNORMAL LOW (ref 0.44–1.00)
GFR, Estimated: >60 mL/min (ref >60)
Glucose, Bld: 90 mg/dL (ref 70–99)
Potassium: 4.3 mmol/L (ref 3.5–5.1)
Sodium: 136 mmol/L (ref 135–145)

## 2020-08-13 LAB — CBC
HCT: 29.6 % — ABNORMAL LOW (ref 36.0–46.0)
Hemoglobin: 8.4 g/dL — ABNORMAL LOW (ref 12.0–15.0)
MCH: 20.6 pg — ABNORMAL LOW (ref 26.0–34.0)
MCHC: 28.4 g/dL — ABNORMAL LOW (ref 30.0–36.0)
MCV: 72.7 fL — ABNORMAL LOW (ref 80.0–100.0)
Platelets: 325 10*3/uL (ref 150–400)
RBC: 4.07 MIL/uL (ref 3.87–5.11)
RDW: 19.5 % — ABNORMAL HIGH (ref 11.5–15.5)
WBC: 8.5 10*3/uL (ref 4.0–10.5)
nRBC: 1.2 % — ABNORMAL HIGH (ref 0.0–0.2)

## 2020-08-13 LAB — SARS CORONAVIRUS 2 (TAT 6-24 HRS): SARS Coronavirus 2: NEGATIVE

## 2020-08-14 LAB — RPR: RPR Ser Ql: NONREACTIVE

## 2020-08-15 ENCOUNTER — Inpatient Hospital Stay (HOSPITAL_COMMUNITY): Payer: Medicaid Other | Admitting: Anesthesiology

## 2020-08-15 ENCOUNTER — Other Ambulatory Visit: Payer: Self-pay

## 2020-08-15 ENCOUNTER — Encounter (HOSPITAL_COMMUNITY)
Admission: AD | Disposition: A | Discharge status: Home / Self Care | Payer: Self-pay | Source: Home / Self Care | Attending: Obstetrics & Gynecology

## 2020-08-15 ENCOUNTER — Inpatient Hospital Stay (HOSPITAL_COMMUNITY)
Admission: AD | Admit: 2020-08-15 | Discharge: 2020-08-17 | DRG: 785 | Disposition: A | Discharge status: Home / Self Care | Payer: Medicaid Other | Attending: Obstetrics & Gynecology | Admitting: Obstetrics & Gynecology

## 2020-08-15 ENCOUNTER — Encounter (HOSPITAL_COMMUNITY): Payer: Self-pay | Admitting: Obstetrics & Gynecology

## 2020-08-15 DIAGNOSIS — O34211 Maternal care for low transverse scar from previous cesarean delivery: Secondary | ICD-10-CM | POA: Diagnosis present

## 2020-08-15 DIAGNOSIS — O9962 Diseases of the digestive system complicating childbirth: Secondary | ICD-10-CM | POA: Diagnosis present

## 2020-08-15 DIAGNOSIS — K219 Gastro-esophageal reflux disease without esophagitis: Secondary | ICD-10-CM | POA: Diagnosis present

## 2020-08-15 DIAGNOSIS — Z3A39 39 weeks gestation of pregnancy: Secondary | ICD-10-CM

## 2020-08-15 DIAGNOSIS — D509 Iron deficiency anemia, unspecified: Secondary | ICD-10-CM | POA: Diagnosis present

## 2020-08-15 DIAGNOSIS — O3413 Maternal care for benign tumor of corpus uteri, third trimester: Secondary | ICD-10-CM | POA: Diagnosis present

## 2020-08-15 DIAGNOSIS — Z20822 Contact with and (suspected) exposure to covid-19: Secondary | ICD-10-CM | POA: Diagnosis present

## 2020-08-15 DIAGNOSIS — D252 Subserosal leiomyoma of uterus: Secondary | ICD-10-CM | POA: Diagnosis present

## 2020-08-15 DIAGNOSIS — Z302 Encounter for sterilization: Secondary | ICD-10-CM

## 2020-08-15 DIAGNOSIS — Z9851 Tubal ligation status: Secondary | ICD-10-CM

## 2020-08-15 DIAGNOSIS — O9902 Anemia complicating childbirth: Secondary | ICD-10-CM | POA: Diagnosis present

## 2020-08-15 DIAGNOSIS — O24420 Gestational diabetes mellitus in childbirth, diet controlled: Secondary | ICD-10-CM | POA: Diagnosis present

## 2020-08-15 SURGERY — Surgical Case
Anesthesia: Spinal

## 2020-08-15 MED ORDER — DIPHENHYDRAMINE HCL 25 MG PO CAPS: 25.0000 mg | ORAL_CAPSULE | Freq: Four times a day (QID) | ORAL | Status: DC | PRN

## 2020-08-15 MED ORDER — ONDANSETRON HCL 4 MG/2ML IJ SOLN
INTRAMUSCULAR | Status: DC | PRN
  Administered 2020-08-15: 4 mg via INTRAVENOUS

## 2020-08-15 MED ORDER — IBUPROFEN 600 MG PO TABS
600.0000 mg | ORAL_TABLET | Freq: Four times a day (QID) | ORAL | Status: DC
  Administered 2020-08-16 – 2020-08-17 (×3): 600 mg via ORAL
  Filled 2020-08-15 (×3): qty 1

## 2020-08-15 MED ORDER — OXYTOCIN-SODIUM CHLORIDE 30-0.9 UT/500ML-% IV SOLN
INTRAVENOUS | Status: AC
  Filled 2020-08-15: qty 500

## 2020-08-15 MED ORDER — GENTAMICIN SULFATE 40 MG/ML IJ SOLN
5.0000 mg/kg | INTRAVENOUS | Status: AC
  Administered 2020-08-15: 350 mg via INTRAVENOUS
  Filled 2020-08-15: qty 8.75

## 2020-08-15 MED ORDER — MORPHINE SULFATE (PF) 0.5 MG/ML IJ SOLN
INTRAMUSCULAR | Status: AC
  Filled 2020-08-15: qty 10

## 2020-08-15 MED ORDER — FENTANYL CITRATE (PF) 100 MCG/2ML IJ SOLN
INTRAMUSCULAR | Status: DC | PRN
  Administered 2020-08-15: 85 ug via INTRATHECAL
  Administered 2020-08-15: 15 ug via INTRATHECAL

## 2020-08-15 MED ORDER — TRANEXAMIC ACID-NACL 1000-0.7 MG/100ML-% IV SOLN
INTRAVENOUS | Status: AC
  Filled 2020-08-15: qty 100

## 2020-08-15 MED ORDER — MORPHINE SULFATE (PF) 0.5 MG/ML IJ SOLN
INTRAMUSCULAR | Status: DC | PRN
  Administered 2020-08-15: 150 ug via INTRATHECAL

## 2020-08-15 MED ORDER — NALBUPHINE HCL 10 MG/ML IJ SOLN: 5.0000 mg | Freq: Once | INTRAMUSCULAR | Status: DC | PRN

## 2020-08-15 MED ORDER — CARBOPROST TROMETHAMINE 250 MCG/ML IM SOLN: 250.0000 ug | Freq: Once | INTRAMUSCULAR | Status: DC

## 2020-08-15 MED ORDER — FENTANYL CITRATE (PF) 100 MCG/2ML IJ SOLN
INTRAMUSCULAR | Status: AC
  Filled 2020-08-15: qty 2

## 2020-08-15 MED ORDER — DEXAMETHASONE SODIUM PHOSPHATE 4 MG/ML IJ SOLN
INTRAMUSCULAR | Status: DC | PRN
  Administered 2020-08-15: 8 mg via INTRAVENOUS

## 2020-08-15 MED ORDER — ONDANSETRON HCL 4 MG/2ML IJ SOLN
INTRAMUSCULAR | Status: AC
  Filled 2020-08-15: qty 2

## 2020-08-15 MED ORDER — METOCLOPRAMIDE HCL 5 MG/ML IJ SOLN
INTRAMUSCULAR | Status: AC
  Filled 2020-08-15: qty 2

## 2020-08-15 MED ORDER — DIPHENHYDRAMINE HCL 25 MG PO CAPS: 25.0000 mg | ORAL_CAPSULE | ORAL | Status: DC | PRN

## 2020-08-15 MED ORDER — DIPHENHYDRAMINE HCL 50 MG/ML IJ SOLN: 12.5000 mg | INTRAMUSCULAR | Status: DC | PRN

## 2020-08-15 MED ORDER — LOPERAMIDE HCL 2 MG PO CAPS: 4.0000 mg | ORAL_CAPSULE | ORAL | Status: DC | PRN

## 2020-08-15 MED ORDER — SCOPOLAMINE 1 MG/3DAYS TD PT72: 1.0000 | MEDICATED_PATCH | Freq: Once | TRANSDERMAL | Status: DC

## 2020-08-15 MED ORDER — ACETAMINOPHEN 500 MG PO TABS
1000.0000 mg | ORAL_TABLET | Freq: Four times a day (QID) | ORAL | Status: DC
  Administered 2020-08-15 – 2020-08-17 (×7): 1000 mg via ORAL
  Filled 2020-08-15 (×7): qty 2

## 2020-08-15 MED ORDER — MEPERIDINE HCL 25 MG/ML IJ SOLN
6.2500 mg | INTRAMUSCULAR | Status: DC | PRN
Start: 2020-08-15 — End: 2020-08-15

## 2020-08-15 MED ORDER — NALOXONE HCL 4 MG/10ML IJ SOLN
1.0000 ug/kg/h | INTRAVENOUS | Status: DC | PRN
  Filled 2020-08-15: qty 5

## 2020-08-15 MED ORDER — PRENATAL MULTIVITAMIN CH
1.0000 | ORAL_TABLET | Freq: Every day | ORAL | Status: DC
  Administered 2020-08-16: 1 via ORAL
  Filled 2020-08-15: qty 1

## 2020-08-15 MED ORDER — HYDROMORPHONE HCL 1 MG/ML IJ SOLN: 0.2000 mg | INTRAMUSCULAR | Status: DC | PRN

## 2020-08-15 MED ORDER — ONDANSETRON HCL 4 MG/2ML IJ SOLN
4.0000 mg | Freq: Three times a day (TID) | INTRAMUSCULAR | Status: DC | PRN
Start: 2020-08-15 — End: 2020-08-17

## 2020-08-15 MED ORDER — NALBUPHINE HCL 10 MG/ML IJ SOLN: 5.0000 mg | INTRAMUSCULAR | Status: DC | PRN

## 2020-08-15 MED ORDER — ZOLPIDEM TARTRATE 5 MG PO TABS: 5.0000 mg | ORAL_TABLET | Freq: Every evening | ORAL | Status: DC | PRN

## 2020-08-15 MED ORDER — SCOPOLAMINE 1 MG/3DAYS TD PT72
MEDICATED_PATCH | TRANSDERMAL | Status: AC
  Filled 2020-08-15: qty 1

## 2020-08-15 MED ORDER — TRANEXAMIC ACID-NACL 1000-0.7 MG/100ML-% IV SOLN
INTRAVENOUS | Status: DC | PRN
  Administered 2020-08-15: 1000 mg via INTRAVENOUS

## 2020-08-15 MED ORDER — CLINDAMYCIN PHOSPHATE 300 MG/50ML IV SOLN
INTRAVENOUS | Status: AC
  Filled 2020-08-15: qty 50

## 2020-08-15 MED ORDER — DEXMEDETOMIDINE (PRECEDEX) IN NS 20 MCG/5ML (4 MCG/ML) IV SYRINGE
PREFILLED_SYRINGE | INTRAVENOUS | Status: DC | PRN
  Administered 2020-08-15: 4 ug via INTRAVENOUS
  Administered 2020-08-15: 8 ug via INTRAVENOUS
  Administered 2020-08-15: 4 ug via INTRAVENOUS

## 2020-08-15 MED ORDER — DIBUCAINE (PERIANAL) 1 % EX OINT: 1.0000 "application " | TOPICAL_OINTMENT | CUTANEOUS | Status: DC | PRN

## 2020-08-15 MED ORDER — DEXAMETHASONE SODIUM PHOSPHATE 4 MG/ML IJ SOLN
INTRAMUSCULAR | Status: AC
  Filled 2020-08-15: qty 2

## 2020-08-15 MED ORDER — CLINDAMYCIN PHOSPHATE 600 MG/50ML IV SOLN
INTRAVENOUS | Status: AC
  Filled 2020-08-15: qty 50

## 2020-08-15 MED ORDER — COCONUT OIL OIL
1.0000 "application " | TOPICAL_OIL | Status: DC | PRN
  Administered 2020-08-17: 1 via TOPICAL

## 2020-08-15 MED ORDER — WITCH HAZEL-GLYCERIN EX PADS: 1.0000 "application " | MEDICATED_PAD | CUTANEOUS | Status: DC | PRN

## 2020-08-15 MED ORDER — OXYTOCIN-SODIUM CHLORIDE 30-0.9 UT/500ML-% IV SOLN: 2.5000 [IU]/h | INTRAVENOUS | Status: AC

## 2020-08-15 MED ORDER — LACTATED RINGERS IV SOLN: INTRAVENOUS | Status: DC

## 2020-08-15 MED ORDER — SENNOSIDES-DOCUSATE SODIUM 8.6-50 MG PO TABS
2.0000 | ORAL_TABLET | Freq: Every day | ORAL | Status: DC
  Administered 2020-08-16: 2 via ORAL
  Filled 2020-08-15: qty 2

## 2020-08-15 MED ORDER — SODIUM CHLORIDE 0.9% FLUSH: 3.0000 mL | INTRAVENOUS | Status: DC | PRN

## 2020-08-15 MED ORDER — KETOROLAC TROMETHAMINE 30 MG/ML IJ SOLN
30.0000 mg | Freq: Four times a day (QID) | INTRAMUSCULAR | Status: AC
  Administered 2020-08-15 – 2020-08-16 (×3): 30 mg via INTRAVENOUS
  Filled 2020-08-15 (×3): qty 1

## 2020-08-15 MED ORDER — KETOROLAC TROMETHAMINE 30 MG/ML IJ SOLN: 30.0000 mg | Freq: Once | INTRAMUSCULAR | Status: DC | PRN

## 2020-08-15 MED ORDER — NALOXONE HCL 0.4 MG/ML IJ SOLN: 0.4000 mg | INTRAMUSCULAR | Status: DC | PRN

## 2020-08-15 MED ORDER — OXYCODONE HCL 5 MG PO TABS
5.0000 mg | ORAL_TABLET | ORAL | Status: DC | PRN
  Administered 2020-08-16: 5 mg via ORAL
  Filled 2020-08-15: qty 1

## 2020-08-15 MED ORDER — METOCLOPRAMIDE HCL 5 MG/ML IJ SOLN
INTRAMUSCULAR | Status: DC | PRN
  Administered 2020-08-15: 10 mg via INTRAVENOUS

## 2020-08-15 MED ORDER — LACTATED RINGERS IV SOLN: INTRAVENOUS | Status: DC | PRN

## 2020-08-15 MED ORDER — POVIDONE-IODINE 10 % EX SWAB: 2.0000 "application " | Freq: Once | CUTANEOUS | Status: DC

## 2020-08-15 MED ORDER — CLINDAMYCIN PHOSPHATE 900 MG/50ML IV SOLN
900.0000 mg | INTRAVENOUS | Status: AC
  Administered 2020-08-15: 900 mg via INTRAVENOUS

## 2020-08-15 MED ORDER — FERROUS SULFATE 325 (65 FE) MG PO TABS
325.0000 mg | ORAL_TABLET | Freq: Every day | ORAL | Status: DC
  Administered 2020-08-16 – 2020-08-17 (×2): 325 mg via ORAL
  Filled 2020-08-15 (×2): qty 1

## 2020-08-15 MED ORDER — BUPIVACAINE IN DEXTROSE 0.75-8.25 % IT SOLN
INTRATHECAL | Status: DC | PRN
  Administered 2020-08-15: 1.4 mL via INTRATHECAL

## 2020-08-15 MED ORDER — SIMETHICONE 80 MG PO CHEW: 80.0000 mg | CHEWABLE_TABLET | ORAL | Status: DC | PRN

## 2020-08-15 MED ORDER — CLINDAMYCIN PHOSPHATE 900 MG/50ML IV SOLN
INTRAVENOUS | Status: AC
  Filled 2020-08-15: qty 50

## 2020-08-15 MED ORDER — PROMETHAZINE HCL 25 MG/ML IJ SOLN: 6.2500 mg | INTRAMUSCULAR | Status: DC | PRN

## 2020-08-15 MED ORDER — SCOPOLAMINE 1 MG/3DAYS TD PT72
MEDICATED_PATCH | TRANSDERMAL | Status: DC | PRN
  Administered 2020-08-15: 1 via TRANSDERMAL

## 2020-08-15 MED ORDER — PHENYLEPHRINE HCL-NACL 20-0.9 MG/250ML-% IV SOLN
INTRAVENOUS | Status: DC | PRN
  Administered 2020-08-15: 60 ug/min via INTRAVENOUS

## 2020-08-15 MED ORDER — OXYTOCIN-SODIUM CHLORIDE 30-0.9 UT/500ML-% IV SOLN
INTRAVENOUS | Status: DC | PRN
  Administered 2020-08-15: 300 mL via INTRAVENOUS

## 2020-08-15 MED ORDER — PHENYLEPHRINE 40 MCG/ML (10ML) SYRINGE FOR IV PUSH (FOR BLOOD PRESSURE SUPPORT)
PREFILLED_SYRINGE | INTRAVENOUS | Status: AC
  Filled 2020-08-15: qty 10

## 2020-08-15 MED ORDER — PANTOPRAZOLE SODIUM 20 MG PO TBEC
20.0000 mg | DELAYED_RELEASE_TABLET | Freq: Two times a day (BID) | ORAL | Status: DC
  Administered 2020-08-16 – 2020-08-17 (×3): 20 mg via ORAL
  Filled 2020-08-15 (×3): qty 1

## 2020-08-15 MED ORDER — PHENYLEPHRINE HCL-NACL 20-0.9 MG/250ML-% IV SOLN
INTRAVENOUS | Status: AC
  Filled 2020-08-15: qty 250

## 2020-08-15 MED ORDER — FENTANYL CITRATE (PF) 100 MCG/2ML IJ SOLN: 25.0000 ug | INTRAMUSCULAR | Status: DC | PRN

## 2020-08-15 MED ORDER — MENTHOL 3 MG MT LOZG: 1.0000 | LOZENGE | OROMUCOSAL | Status: DC | PRN

## 2020-08-15 SURGICAL SUPPLY — 36 items
BENZOIN TINCTURE PRP APPL 2/3 (GAUZE/BANDAGES/DRESSINGS) ×2 IMPLANT
CLAMP CORD UMBIL (MISCELLANEOUS) IMPLANT
CLOTH BEACON ORANGE TIMEOUT ST (SAFETY) ×2 IMPLANT
DERMABOND ADVANCED (GAUZE/BANDAGES/DRESSINGS) ×1
DERMABOND ADVANCED .7 DNX12 (GAUZE/BANDAGES/DRESSINGS) ×1 IMPLANT
DRAPE C SECTION CLR SCREEN (DRAPES) ×2 IMPLANT
DRSG OPSITE POSTOP 4X10 (GAUZE/BANDAGES/DRESSINGS) ×2 IMPLANT
ELECT REM PT RETURN 9FT ADLT (ELECTROSURGICAL) ×2
ELECTRODE REM PT RTRN 9FT ADLT (ELECTROSURGICAL) ×1 IMPLANT
EXTRACTOR VACUUM M CUP 4 TUBE (SUCTIONS) IMPLANT
GLOVE BIOGEL PI IND STRL 7.0 (GLOVE) ×2 IMPLANT
GLOVE BIOGEL PI INDICATOR 7.0 (GLOVE) ×2
GLOVE SURG SS PI 6.5 STRL IVOR (GLOVE) ×2 IMPLANT
GOWN STRL REUS W/TWL LRG LVL3 (GOWN DISPOSABLE) ×4 IMPLANT
KIT ABG SYR 3ML LUER SLIP (SYRINGE) IMPLANT
NEEDLE HYPO 25X5/8 SAFETYGLIDE (NEEDLE) IMPLANT
NS IRRIG 1000ML POUR BTL (IV SOLUTION) ×2 IMPLANT
PACK C SECTION WH (CUSTOM PROCEDURE TRAY) ×2 IMPLANT
PAD OB MATERNITY 4.3X12.25 (PERSONAL CARE ITEMS) ×2 IMPLANT
PENCIL SMOKE EVAC W/HOLSTER (ELECTROSURGICAL) ×2 IMPLANT
RTRCTR C-SECT PINK 25CM LRG (MISCELLANEOUS) ×2 IMPLANT
STRIP CLOSURE SKIN 1/2X4 (GAUZE/BANDAGES/DRESSINGS) ×2 IMPLANT
SUT CHROMIC 1 CTX 36 (SUTURE) IMPLANT
SUT CHROMIC 2 0 CT 1 (SUTURE) ×2 IMPLANT
SUT MON AB 4-0 PS1 27 (SUTURE) ×2 IMPLANT
SUT PLAIN 1 NONE 54 (SUTURE) ×2 IMPLANT
SUT PLAIN 2 0 (SUTURE)
SUT PLAIN 2 0 XLH (SUTURE) IMPLANT
SUT PLAIN ABS 2-0 CT1 27XMFL (SUTURE) IMPLANT
SUT VIC AB 0 CTX 36 (SUTURE) ×1
SUT VIC AB 0 CTX36XBRD ANBCTRL (SUTURE) ×1 IMPLANT
SUT VIC AB 1 CTX 36 (SUTURE) ×2
SUT VIC AB 1 CTX36XBRD ANBCTRL (SUTURE) ×2 IMPLANT
TOWEL OR 17X24 6PK STRL BLUE (TOWEL DISPOSABLE) ×2 IMPLANT
TRAY FOLEY W/BAG SLVR 14FR LF (SET/KITS/TRAYS/PACK) ×2 IMPLANT
WATER STERILE IRR 1000ML POUR (IV SOLUTION) ×2 IMPLANT

## 2020-08-15 NOTE — H&P (Addendum)
Caroline Yang is a 40 y.o. female, G5P3013, IUP at 39.2 weeks, presenting for Repeat CS with BTL with D Opie Fanton. Pt endorse + Fm. Denies vaginal leakage. Denies vaginal bleeding. Denies feeling cxt's. H/O anemia (3/28: Hgb 9.1, fusion plus recommended, f/u CBC in 1 month.). GDMA1 (Dx at 32 weeks, refer to [facility], check FBS and 2 hour PCs.). AMA (Plan for fetal testing from 36 weeks of pregnancy, delivery by [redacted] weeks EGA.). Antenatal findings (AFP4 showed elevated risk of Down syndrome (1:217). Panorama showed low risk.). H/O CS (2 vaginal deliveries then 1 c/s for abnormal fetal heart tracing, desires Repeat C/S, now declines tubal as of 08/03/20.).    Patient Active Problem List   Diagnosis Date Noted   Encounter for maternal care for low transverse scar from repeat cesarean delivery 08/15/2020   Status post primary low transverse cesarean section 09/09/2014   IUGR (intrauterine growth restriction) 07/08/2014   Oligohydramnios 07/08/2014   Positive GBS test 07/08/2014     Active Ambulatory Problems    Diagnosis Date Noted   IUGR (intrauterine growth restriction) 07/08/2014   Oligohydramnios 07/08/2014   Positive GBS test 07/08/2014   Status post primary low transverse cesarean section 09/09/2014   Resolved Ambulatory Problems    Diagnosis Date Noted   No Resolved Ambulatory Problems   Past Medical History:  Diagnosis Date   Complication of anesthesia    GERD (gastroesophageal reflux disease)    Gestational diabetes       Medications Prior to Admission  Medication Sig Dispense Refill Last Dose   acetaminophen (TYLENOL) 500 MG tablet Take 500-1,000 mg by mouth every 6 (six) hours as needed (pain).      ferrous sulfate 325 (65 FE) MG tablet Take 325 mg by mouth at bedtime.      ondansetron (ZOFRAN ODT) 4 MG disintegrating tablet Take 1 tablet (4 mg total) by mouth every 6 (six) hours as needed for nausea. 20 tablet 0    pantoprazole (PROTONIX) 20 MG tablet Take 20 mg by mouth daily as  needed for heartburn or indigestion.      Prenat-FeAsp-Meth-FA-DHA w/o A (PRENATE PIXIE) 10-0.6-0.4-200 MG CAPS Take 1 capsule by mouth at bedtime.       Past Medical History:  Diagnosis Date   Complication of anesthesia    itching   GERD (gastroesophageal reflux disease)    occasionally and if needs meds uses OTC   Gestational diabetes      No current facility-administered medications on file prior to encounter.   Current Outpatient Medications on File Prior to Encounter  Medication Sig Dispense Refill   acetaminophen (TYLENOL) 500 MG tablet Take 500-1,000 mg by mouth every 6 (six) hours as needed (pain).     ferrous sulfate 325 (65 FE) MG tablet Take 325 mg by mouth at bedtime.     ondansetron (ZOFRAN ODT) 4 MG disintegrating tablet Take 1 tablet (4 mg total) by mouth every 6 (six) hours as needed for nausea. 20 tablet 0   pantoprazole (PROTONIX) 20 MG tablet Take 20 mg by mouth daily as needed for heartburn or indigestion.     Prenat-FeAsp-Meth-FA-DHA w/o A (PRENATE PIXIE) 10-0.6-0.4-200 MG CAPS Take 1 capsule by mouth at bedtime.       Allergies  Allergen Reactions   Penicillins Itching    History of present pregnancy: Pt Info/Preference:  Screening/Consents:  Labs:   EDD: Estimated Date of Delivery: 08/20/20  Establised: Patient's last menstrual period was 11/14/2019.  Anatomy Scan: Date: 05/10/2020 Placenta  Location: anterior Genetic Screen: [place]:LR AFP: Normal First Tri: Quad: Horizon: Normal  Office: [facility]            Md: DR Alesia Richards First PNV: 9.5 weeks Blood Type --/--/A POS (06/10 0912)  Language: English/arabic Last PNV: 38.6 weeks Rhogam    Flu Vaccine:  declined   Antibody NEG (06/10 0912)  TDaP vaccine UTD   GTT: Early: 5.9 Third Trimester: failed 1 h , passed 3 H GTT  Feeding Plan: Breast/bottle BTL: Desires. Consent signed 07/16/2020 Rubella: Immune (10/20 0000)  Contraception: BTL VBAC: no RPR: NON REACTIVE (06/10 0905)   Circumcision: ???   HBsAg: Negative  (10/20 0000)  Pediatrician:  ???   HIV: Non-reactive (10/20 0000)   Prenatal Classes: no Additional [place]: No GBS:  (For PCN allergy, check sensitivities)       Chlamydia: Neg    MFM Referral/Consult:  GC: Neg  Support Person: husband   PAP: ???  Pain Management: spinal Neonatologist Referral:  Hgb Electrophoresis:  AA  Birth Plan: [place]   Hgb NOB: 11.3    28W: 9.1   OB History     Gravida  5   Para  3   Term  3   Preterm      AB  1   Living  3      SAB  1   IAB      Ectopic      Multiple  0   Live Births  3          Past Medical History:  Diagnosis Date   Complication of anesthesia    itching   GERD (gastroesophageal reflux disease)    occasionally and if needs meds uses OTC   Gestational diabetes    Past Surgical History:  Procedure Laterality Date   CESAREAN SECTION N/A 09/08/2014   Procedure: CESAREAN SECTION;  Surgeon: Everett Graff, MD;  Location: [facility];  Service: Obstetrics;  Laterality: N/A;   CHOLECYSTECTOMY N/A 05/04/2015   Procedure: LAPAROSCOPIC CHOLECYSTECTOMY WITH INTRAOPERATIVE CHOLANGIOGRAM;  Surgeon: Stark Klein, MD;  Location: [facility];  Service: General;  Laterality: N/A;   DILATION AND EVACUATION N/A 11/28/2012   Procedure: DILATATION AND EVACUATION, WITH INTRA-OPERATIVE ULTRSOUND;  Surgeon: Lavonia Drafts, MD;  Location: [facility];  Service: Gynecology;  Laterality: N/A;   WISDOM TOOTH EXTRACTION     Family History: family history includes Thyroid disease in her son. Social History:  reports that she has never smoked. She has never used smokeless tobacco. She reports that she does not drink alcohol and does not use drugs.   Prenatal Transfer Tool  Maternal Diabetes: Yes:  Diabetes Type:  Diet controlled Genetic Screening: Normal Maternal Ultrasounds/Referrals: NormalAFP4 showed elevated risk of Down syndrome (1:217). Panorama showed low risk. Fetal Ultrasounds or other Referrals:  None Maternal Substance Abuse:  No Significant  Maternal Medications:  None Significant Maternal Lab Results: Group B Strep negative  ROS:  Review of Systems  Constitutional: Negative.   HENT: Negative.    Eyes: Negative.   Respiratory: Negative.    Cardiovascular: Negative.   Gastrointestinal: Negative.   Genitourinary: Negative.   Musculoskeletal: Negative.   Skin: Negative.   Neurological: Negative.   Endo/Heme/Allergies: Negative.   Psychiatric/Behavioral: Negative.      Physical Exam: BP 137/73   Pulse 76   Temp 97.9 F (36.6 C)   Resp 20   Ht 5\' 1"  (1.549 m)   Wt 90.7 kg   LMP 11/14/2019   SpO2 96%  BMI 37.79 kg/m   Physical Exam Vitals and nursing note reviewed. Exam conducted with a chaperone present.  Constitutional:      Appearance: Normal appearance.  HENT:     Head: Normocephalic.     Nose: Nose normal.     Mouth/Throat:     Mouth: Mucous membranes are moist.  Eyes:     Conjunctiva/sclera: Conjunctivae normal.  Cardiovascular:     Rate and Rhythm: Normal rate and regular rhythm.     Pulses: Normal pulses.     Heart sounds: Normal heart sounds.  Pulmonary:     Effort: Pulmonary effort is normal.     Breath sounds: Normal breath sounds.  Abdominal:     General: Bowel sounds are normal.  Genitourinary:    Comments: derferred Musculoskeletal:        General: Normal range of motion.     Cervical back: Normal range of motion and neck supple.  Skin:    Capillary Refill: Capillary refill takes less than 2 seconds.  Neurological:     General: No focal deficit present.     Mental Status: She is alert.  Psychiatric:        Mood and Affect: Mood normal.     FHT:  UC:   none Leopold's: Position vertex, EFW   Labs: No results found for this or any previous visit (from the past 24 hour(s)).  Imaging:  No results found.  MAU Course: Orders Placed This Encounter  Procedures   Diet NPO time specified Except for: Sips with Meds   Informed Consent Details: Physician/Practitioner Attestation;  Transcribe to consent form and obtain patient signature   Initiate Pre-op Protocol   Pre-admission testing diagnosis   Anesthesia per Enhanced Recovery Pathway   Clip operative site   SCD's   Verify informed consent   Patient needs pre-operative appointment    Sequential compression device (SCD's) to OR   No pregnancy test required   Inpatient consult to spiritual care   Admit to Inpatient (patient's expected length of stay will be greater than 2 midnights or inpatient only procedure)   Meds ordered this encounter  Medications   AND Linked Order Group    clindamycin (CLEOCIN) IVPB 900 mg     Order Specific Question:   Indication:     Answer:   Surgical Prophylaxis    gentamicin (GARAMYCIN) 350 mg in dextrose 5 % 100 mL IVPB     Order Specific Question:   Indication:     Answer:   Surgical Prophylaxis   povidone-iodine 10 % swab 2 application   lactated ringers infusion    Assessment/Plan: Caroline Yang is a 40 y.o. female, G5P3013, IUP at 39.2 weeks, presenting for Repeat CS with BTL with D Irena Gaydos. Pt endorse + Fm. Denies vaginal leakage. Denies vaginal bleeding. Denies feeling cxt's. H/O anemia (3/28: Hgb 9.1, fusion plus recommended, f/u CBC in 1 month.). GDMA1 (Dx at 32 weeks, refer to [facility], check FBS and 2 hour PCs.). AMA (Plan for fetal testing from 36 weeks of pregnancy, delivery by [redacted] weeks EGA.). Antenatal findings (AFP4 showed elevated risk of Down syndrome (1:217). Panorama showed low risk.). H/O CS (2 vaginal deliveries then 1 c/s for abnormal fetal heart tracing, desires Repeat C/S, now declines tubal as of 08/03/20.).   Plan: Admit to Pre-op per consult with DR Alesia Richards Routine pre op [facility] orders Spinal epidural SCD Foley Clip site [place] NP-C, CNM, MSN 08/15/2020, 10:44 AM  MD  addendum: Patient now desires postpartum tubal ligation via complete bilateral salpingectomy.  We discussed risks, benefits and alternatives of postpartum  tubal ligation including but not limited to risks of bleeding, infection, damage to organs.  She also understood the risk of tubal regret but she states she is 100% sure she did not want any more children.  We discussed that complete removal of both tubes will ensure complete sterilization but as with every procedure it is not 100% guaranteed.  She understood there were other kinds of birth control such as pills, patches, IUDs, vaginal rings and depo provera which were temporary but she did not desire them.  She understood there was also an option of female sterilization but she did not desire that option either.  She did not desire partial salpingectomy or tubal fulguration or placement of tubal clips.  All her questions were answered and she was consented for the procedure.  We discussed cell saver use and she desires this option. We discussed possible blood transfusion and she will accept it if needed.  Reviewed labs: Recent Results (from the past 2160 hour(s))  RPR     Status: None   Collection Time: 08/13/20  9:05 AM  Result Value Ref Range   RPR Ser Ql NON REACTIVE NON REACTIVE    Comment: Performed at [facility], [address] [place], [place] [address]  CBC     Status: Abnormal   Collection Time: 08/13/20  9:05 AM  Result Value Ref Range   WBC 8.5 4.0 - 10.5 K/uL   RBC 4.07 3.87 - 5.11 MIL/uL   Hemoglobin 8.4 (L) 12.0 - 15.0 g/dL    Comment: Reticulocyte Hemoglobin testing may be clinically indicated, consider ordering this additional test [id number]    HCT 29.6 (L) 36.0 - 46.0 %   MCV 72.7 (L) 80.0 - 100.0 fL   MCH 20.6 (L) 26.0 - 34.0 pg   MCHC 28.4 (L) 30.0 - 36.0 g/dL   RDW 19.5 (H) 11.5 - 15.5 %   Platelets 325 150 - 400 K/uL   nRBC 1.2 (H) 0.0 - 0.2 %    Comment: Performed at [facility], [place] [address] [place], [place] [address]  Basic metabolic panel     Status: Abnormal   Collection Time: 08/13/20  9:05 AM  Result Value Ref Range   Sodium 136 135 - 145  mmol/L   Potassium 4.3 3.5 - 5.1 mmol/L   Chloride 104 98 - 111 mmol/L   CO2 22 22 - 32 mmol/L   Glucose, Bld 90 70 - 99 mg/dL    Comment: Glucose reference range applies only to samples taken after fasting for at least 8 hours.   BUN 9 6 - 20 mg/dL   Creatinine, Ser 0.35 (L) 0.44 - 1.00 mg/dL   Calcium 9.0 8.9 - 10.3 mg/dL   GFR, Estimated >60 >60 mL/min    Comment: (NOTE) Calculated using the CKD-EPI Creatinine Equation (2021)    Anion gap 10 5 - 15    Comment: Performed at [place] [address] [place], [place] [address]  Type and screen     Status: None   Collection Time: 08/13/20  9:12 AM  Result Value Ref Range   ABO/RH(D) A POS    Antibody Screen NEG    Sample Expiration      08/16/2020,2359 Performed at [facility], [place] [address] [place], [place] [address]   SARS CORONAVIRUS 2 (TAT 6-24  HRS) Nasopharyngeal Nasopharyngeal Swab     Status: None   Collection Time: 08/13/20  9:50 AM   Specimen: Nasopharyngeal Swab  Result Value Ref Range   SARS Coronavirus 2 NEGATIVE NEGATIVE    Comment: (NOTE) SARS-CoV-2 target nucleic acids are NOT DETECTED.  The SARS-CoV-2 RNA is generally detectable in upper and lower respiratory specimens during the acute phase of infection. Negative results do not preclude SARS-CoV-2 infection, do not rule out co-infections with other pathogens, and should not be used as the sole basis for treatment or other patient management decisions. Negative results must be combined with clinical observations, patient history, and epidemiological information. The expected result is Negative.  Fact Sheet for Patients: SugarRoll.be  Fact Sheet for Healthcare Providers: https://www.woods-mathews.com/  This test is not yet approved or cleared by the [place] FDA and  has been authorized for detection and/or diagnosis of SARS-CoV-2 by FDA under an Emergency Use Authorization (EUA). This EUA  will remain  in effect (meaning this test can be used) for the duration of the COVID-19 declaration under Se ction 564(b)(1) of the Act, 21 U.S.C. section 360bbb-3(b)(1), unless the authorization is terminated or revoked sooner.  Performed at [facility], [place] [address], [place], [place] [address]     Dr. Waymon Amato.  08/15/20. 11:37 AM.

## 2020-08-15 NOTE — Transfer of Care (Signed)
Immediate Anesthesia Transfer of Care Note  Patient: Caroline Yang  Procedure(s) Performed: CESAREAN SECTION WITH BILATERAL TUBAL LIGATION APPLICATION OF CELL SAVER  Patient Location: PACU  Anesthesia Type:Spinal  Level of Consciousness: awake  Airway & Oxygen Therapy: Patient Spontanous Breathing  Post-op Assessment: Report given to RN  Post vital signs: Reviewed and stable  Last Vitals:  Vitals Value Taken Time  BP    Temp    Pulse    Resp    SpO2      Last Pain: There were no vitals filed for this visit.       Complications: No notable events documented.

## 2020-08-15 NOTE — Interval H&P Note (Signed)
History and Physical Interval Note:  08/15/2020 11:38 AM  Caroline Yang  has presented today for surgery, with the diagnosis of repeat desires sterilization.  The various methods of treatment have been discussed with the patient and family. After consideration of risks, benefits and other options for treatment, the patient has consented to  Procedure(s): CESAREAN SECTION WITH BILATERAL TUBAL LIGATION (N/A) APPLICATION OF CELL SAVER (N/A) AND POSTPARTUM TUBAL LIGATION WITH CELL SAVER USE as a surgical intervention.  The patient's history has been reviewed, patient examined, no change in status, stable for surgery.  I have reviewed the patient's chart and labs.  Questions were answered to the patient's satisfaction.    Archie Endo, MD. 08/15/2020. 11:39 AM.

## 2020-08-15 NOTE — Progress Notes (Signed)
Pericare done

## 2020-08-15 NOTE — Progress Notes (Signed)
Pt c/o chest pain 4/10 (pressure). This RN's cardiac an pulmonary observation WNL.  MD notified, and pt stated pain 2/10 and that she had this pain all during the c-section. RN had pt sit up and pt states pain is a little better.

## 2020-08-15 NOTE — Op Note (Signed)
Patient: Caroline Yang DOB: [birth year] MRN:  [id number]  DATE OF SURGERY:  08/15/2020  PREOP DIAGNOSIS: 1. 39 week 2 day EGA IUP. 2.  History of 1 prior cesarean section and desiring a repeat cesarean delivery, she declines trial of labor and vaginal birth after a cesarean section. 3.  Multiparous patient now desiring permanent sterilization.   POSTOP DIAGNOSIS: Same as above.      PROCEDURES:  1. Repeat low uterine segment transverse cesarean section via Pfannenstiel incision.  2. Postpartum bilateral tubal ligation via complete bilateral salpingectomy.    SURGEON: Dr.  Waymon Amato  ASSISTANT:[facility] FNP, CNM.  ATTENDING ATTESTATION: I was present and scrubbed for the procedure and the assistant was required due to the complexity of anatomy.   ANESTHESIA: Spinal  COMPLICATIONS: None  FINDINGS:  Viable female infant in cephalic presentation, DOP, weight pending, Apgar scores of 8 and 9. Bulky uterus with 3cm fundal subserosal fibroid.  Normal left and right fallopian tubes.  Normal left and right ovary.     EBL: 403 cc   IV FLUID:  2800c LR   URINE OUTPUT: 150 cc clear urine  INDICATIONS:   40 y/o P3 who presented for a repeat cesarean section and bilateral tubal ligation.  She was consented for the procedure after explaining risks benefits and alternatives of the procedure including but not limited to risks of heavy bleeding, infection and damage to organs.  She also understood risks, benefits and alternatives of bilateral tubal ligation and permanent nature of sterilization.    PROCEDURE:  Informed consent was obtained from the patient to undergo the procedure. She was taken to the operating room where her spinal anesthesia was found to be adequate. She was prepped and draped in the usual sterile fashion and a Foley catheter was placed. She received gentamicin, clindamycin and tranexamic acid preoperatively. A Pfannenstiel incision was made with the scalpel over the  prior incision and the incision extended through the subcutaneous layer and also the fascia with the bovie. Small perforators in the subcutaneous layer were contained with the Bovie. The fascia was nicked in the midline and then was further separated from the rectus muscles bilaterally using Mayo scissors. Kochers were placed inferiorly and then superiorly to allow further separation of fascia from the rectus muscles.  The peritoneal cavity was entered bluntly with the fingers. The Alexis retractor was placed in. The bladder flap was created using Metzenbaum scissors.  The uterus was incised with a scalpel and the incision extended bluntly bilaterally with fingers.  Membranes were ruptured and moderate clear amniotic fluid was noted.  The head then the rest of the body was then delivered with abdominal pressure.  She delivered a viable female infant, apgar scores 8, 9.  The edges of the uterus was grasped with rIng forceps and T clamps.  The cord was clamped and cut after 1 minute. Cord blood was collected.  The placenta was delivered with gentle traction on the umbilical cord.  The uterus was cleared of clots and debris with a lap.  The uterus was exteriorized.  The uterine incision was closed with #1 Vicryl in a running locked stitch. An imbricating layer of the same stitch was placed over the initial closure.  A lap was placed over the incision and attention turned to the tubes.  The left  fallopian tube was identified and followed up to the fimbria end.    The handheld ligasure impact was used to transect the tube from the mesosalpinx.  Similar procedure was done on the right side.  Both uterine cornua and remaining mesosalpinx were hemostatic.Uterus was returned into the abdomen. Small areas that bled on the left and right sides were contained with sutures.  Irrigation was applied and suctioned out. Excellent hemostasis was noted over the incision.  The muscles and peritoneum were then reapproximated using  chromic suture.  Fascia was closed using 0 Vicryl in a running stitch. The subcutaneous layer was irrigated and suctioned out. Small perforators were contained with the bovie.  The subcutaneous layer was closed using 1-0 plain in interrupted stitches. The skin was closed using 4-0 Vicryl on the Pine needle.  Benzocaine and steri strips were applied.  Honeycomb was then applied. The patient was then cleaned and she was taken to the recovery room with her baby in stable conditions.  The cell saver was available during the procedure. Since only 200 cc was collected during the procedure it wasn't administered back to the patient.    SPECIMEN: Placenta to pathology, umbilical cord blood to lab.   DISPOSITION: TO PACU, STABLE.   Dr. Waymon Amato.    08/15/2020.   03:12 PM.

## 2020-08-15 NOTE — Progress Notes (Addendum)
Caroline Yang [id number] Postpartum Postoperative Day # [place], [id number], [redacted]w[redacted]d, S/P repeat LT Cesarean Section due to elective with BTL. H/O H/O anemia (3/28: Hgb 9.1, fusion plus recommended, f/u CBC in 1 month.). GDMA1 (Dx at 32 weeks, refer to [facility], check FBS and 2 hour PCs.). AMA (Plan for fetal testing from 36 weeks of pregnancy, delivery by [redacted] weeks EGA.). Antenatal findings (AFP4 showed elevated risk of Down syndrome (1:217). Panorama showed low risk.). H/O CS (2 vaginal deliveries then 1 c/s for abnormal fetal heart tracing. Surgical EBLwas 203 hgb drop of 8.4 to 6.8, asymptomatic.   Patient Active Problem List   Diagnosis Date Noted   Encounter for maternal care for low transverse scar from repeat cesarean delivery 08/15/2020   S/P tubal ligation 08/15/2020   Normal postpartum course 08/15/2020   Iron deficiency anemia 08/15/2020   Cesarean delivery delivered 08/15/2020   IUGR (intrauterine growth restriction) 07/08/2014   Oligohydramnios 07/08/2014   Positive GBS test 07/08/2014     Active Ambulatory Problems    Diagnosis Date Noted   IUGR (intrauterine growth restriction) 07/08/2014   Oligohydramnios 07/08/2014   Positive GBS test 07/08/2014   Resolved Ambulatory Problems    Diagnosis Date Noted   Status post primary low transverse cesarean section 09/09/2014   Past Medical History:  Diagnosis Date   Complication of anesthesia    GERD (gastroesophageal reflux disease)    Gestational diabetes      Subjective: Patient up ad lib, denies syncope or dizziness. Reports consuming regular diet without issues and denies N/V. Patient reports 0 bowel movement + passing flatus.  Denies issues with urination, but foley still in place will remove today and reports bleeding is "lighter."  Patient is breastfeeding and reports going well.  Desires BTL performed 6/12 for postpartum contraception.  Pain is being appropriately managed with use of po meds. Pt endorses feeling  slight dizzy at times, and hgb drop, pt verbalized to 2 unit RBC to be transfused at R/B/A reviewed.    Objective: Patient Vitals for the past 24 hrs:  BP Temp Temp src Pulse Resp SpO2 Height Weight  08/16/20 0241 (!) 90/53 98.5 F (36.9 C) Oral 70 17 100 % -- --  08/15/20 2319 (!) 98/48 98.5 F (36.9 C) Oral 66 18 100 % -- --  08/15/20 1946 106/68 98.6 F (37 C) Oral 77 19 100 % -- --  08/15/20 1824 (!) 115/52 (!) 97 F (36.1 C) -- 66 18 100 % -- --  08/15/20 1648 98/74 98.2 F (36.8 C) Axillary 63 -- 100 % -- --  08/15/20 1530 (!) 99/59 98.1 F (36.7 C) -- (!) 57 18 100 % -- --  08/15/20 1500 107/67 -- -- 68 18 100 % -- --  08/15/20 1445 114/67 -- -- 80 (!) 22 100 % -- --  08/15/20 1430 105/65 -- -- 79 (!) 23 100 % -- --  08/15/20 1415 103/63 -- -- 65 (!) 22 100 % -- --  08/15/20 1407 (!) 102/59 97.6 F (36.4 C) Oral 77 (!) 21 100 % -- --  08/15/20 0918 137/73 97.9 F (36.6 C) -- 76 20 96 % 5\' 1"  (1.549 m) 90.7 kg     Physical Exam:  General: alert, cooperative, appears stated age, and no distress Mood/Affect: happy Lungs: clear to auscultation, no wheezes, rales or rhonchi, symmetric air entry.  Heart: normal rate, regular rhythm, normal S1, S2, no murmurs, rubs, clicks or gallops. Breast: breasts appear normal,  no suspicious masses, no skin or nipple changes or axillary nodes. Abdomen:  + bowel sounds, soft, non-tender Incision: healing well, no significant drainage, no dehiscence, no significant erythema, Honeycomb dressing  Uterine Fundus: firm, involution -1 Lochia: appropriate Skin: Warm, Dry. DVT Evaluation: No evidence of DVT seen on physical exam. Negative Homan's sign. No cords or calf tenderness. No significant calf/ankle edema.  Labs: Recent Labs    08/13/20 0905 08/16/20 0421  HGB 8.4* 6.8*  HCT 29.6* 23.3*  WBC 8.5 10.9*    CBG (last 3)  No results for input(s): GLUCAP in the last 72 hours.   I/O: I/O last 3 completed shifts: In: 2774  [P.O.:450; I.V.:2800; Blood:200] Out: 673 [Urine:470; Blood:203]   Assessment Postpartum Postoperative Day # 0  Caroline Yang [place], [id number], [redacted]w[redacted]d, S/P repeat LT Cesarean Section due to elective with BTL. H/O H/O anemia (3/28: Hgb 9.1, fusion plus recommended, f/u CBC in 1 month.). GDMA1 (Dx at 32 weeks, refer to [facility], check FBS and 2 hour PCs.). AMA (Plan for fetal testing from 36 weeks of pregnancy, delivery by [redacted] weeks EGA.). Antenatal findings (AFP4 showed elevated risk of Down syndrome (1:217). Panorama showed low risk.). H/O CS (2 vaginal deliveries then 1 c/s for abnormal fetal heart tracing. Surgical EBLwas 203 hgb drop of 8.4 to 6.8, asymptomatic minus a little dizzy, VSS.  Pt stable. -1 Involution. Breast Feeding. Hemodynamically Stable.  Plan: Continue other mgmt as ordered GDMA1: Fasting pending. NO meds, check 2 H PP today.  Anemia: 2unit RBC to be transfused today 4 hour Post CBC, benadryl and tylenol prior to transfusion, tomorrow start PO Iron, pending CBC today. VTE Prophylactics: SCD, ambulated as tolerates.  Pain control: Motrin/Tylenol/Narcotics PRN Breastfeeding and Lactation consult Plan for discharge POD#2 or 3  Dr. Charlesetta Garibaldi to be updated on patient status when she assumes care at [place] NP-C, [place] 08/16/2020, 5:49 AM

## 2020-08-15 NOTE — Anesthesia Procedure Notes (Signed)
Spinal  Patient location during procedure: OR Start time: 08/15/2020 12:15 PM End time: 08/15/2020 12:24 PM Staffing Performed: anesthesiologist  Anesthesiologist: Darral Dash, DO Preanesthetic Checklist Completed: patient identified, IV checked, site marked, risks and benefits discussed, surgical consent, monitors and equipment checked, pre-op evaluation and timeout performed Spinal Block Patient position: sitting Prep: DuraPrep Patient monitoring: heart rate, cardiac monitor, continuous pulse ox and blood pressure Approach: midline Location: L3-4 Injection technique: single-shot Needle Needle type: Pencan  Needle gauge: 24 G Needle length: 10 cm Assessment Sensory level: T4 Events: CSF return Additional Notes Patient identified. Risks/Benefits/Options discussed with patient including but not limited to bleeding, infection, nerve damage, paralysis, failed block, incomplete pain control, headache, blood pressure changes, nausea, vomiting, reactions to medications, itching and postpartum back pain. Confirmed with bedside nurse the patient's most recent platelet count. Confirmed with patient that they are not currently taking any anticoagulation, have any bleeding history or any family history of bleeding disorders. Patient expressed understanding and wished to proceed. All questions were answered. Sterile technique was used throughout the entire procedure. Please see nursing notes for vital signs. Warning signs of high block given to the patient including shortness of breath, tingling/numbness in hands, complete motor block, or any concerning symptoms with instructions to call for help. Patient was given instructions on fall risk and not to get out of bed. All questions and concerns addressed with instructions to call with any issues or inadequate analgesia.     - Attempted CSE per surgeon request. Poor patient cooperation with positioning in addition to language barrier. Abandoned  after multiple attempts to spinal attempt X 1 successful with CSF. Level confirmed prior to case initiation

## 2020-08-15 NOTE — Anesthesia Preprocedure Evaluation (Signed)
Anesthesia Evaluation    Airway Mallampati: II  TM Distance: >3 FB Neck ROM: Full    Dental  (+) Teeth Intact   Pulmonary neg pulmonary ROS,    Pulmonary exam normal        Cardiovascular negative cardio ROS   Rhythm:Regular Rate:Normal     Neuro/Psych negative neurological ROS  negative psych ROS   GI/Hepatic Neg liver ROS, GERD  Medicated,  Endo/Other  diabetes  Renal/GU negative Renal ROS  negative genitourinary   Musculoskeletal negative musculoskeletal ROS (+)   Abdominal (+)  Abdomen: soft. Bowel sounds: normal.  Peds  Hematology negative hematology ROS (+)   Anesthesia Other Findings   Reproductive/Obstetrics (+) Pregnancy                             Anesthesia Physical Anesthesia Plan  ASA: 2  Anesthesia Plan: Spinal   Post-op Pain Management:    Induction:   PONV Risk Score and Plan: 2 and Treatment may vary due to age or medical condition, Ondansetron and Dexamethasone  Airway Management Planned: Simple Face Mask, Natural Airway and Nasal Cannula  Additional Equipment: None  Intra-op Plan:   Post-operative Plan:   Informed Consent:   Plan Discussed with:   Anesthesia Plan Comments: (Lab Results      Component                Value               Date                      WBC                      8.5                 08/13/2020                HGB                      8.4 (L)             08/13/2020                HCT                      29.6 (L)            08/13/2020                MCV                      72.7 (L)            08/13/2020                PLT                      325                 08/13/2020          )        Anesthesia Quick Evaluation

## 2020-08-16 LAB — CBC
HCT: 23.3 % — ABNORMAL LOW (ref 36.0–46.0)
HCT: 30.8 % — ABNORMAL LOW (ref 36.0–46.0)
Hemoglobin: 6.8 g/dL — CL (ref 12.0–15.0)
Hemoglobin: 9.4 g/dL — ABNORMAL LOW (ref 12.0–15.0)
MCH: 20.8 pg — ABNORMAL LOW (ref 26.0–34.0)
MCH: 23 pg — ABNORMAL LOW (ref 26.0–34.0)
MCHC: 29.2 g/dL — ABNORMAL LOW (ref 30.0–36.0)
MCHC: 30.5 g/dL (ref 30.0–36.0)
MCV: 71.3 fL — ABNORMAL LOW (ref 80.0–100.0)
MCV: 75.5 fL — ABNORMAL LOW (ref 80.0–100.0)
Platelets: 289 10*3/uL (ref 150–400)
Platelets: 307 10*3/uL (ref 150–400)
RBC: 3.27 MIL/uL — ABNORMAL LOW (ref 3.87–5.11)
RBC: 4.08 MIL/uL (ref 3.87–5.11)
RDW: 20.5 % — ABNORMAL HIGH (ref 11.5–15.5)
RDW: 22.1 % — ABNORMAL HIGH (ref 11.5–15.5)
WBC: 10.9 10*3/uL — ABNORMAL HIGH (ref 4.0–10.5)
WBC: 10.8 10*3/uL — ABNORMAL HIGH (ref 4.0–10.5)
nRBC: 0.6 % — ABNORMAL HIGH (ref 0.0–0.2)
nRBC: 0.8 % — ABNORMAL HIGH (ref 0.0–0.2)

## 2020-08-16 LAB — GLUCOSE, CAPILLARY: Glucose-Capillary: 120 mg/dL — ABNORMAL HIGH (ref 70–99)

## 2020-08-16 LAB — PREPARE RBC (CROSSMATCH)

## 2020-08-16 MED ORDER — SODIUM CHLORIDE 0.9% IV SOLUTION
Freq: Once | INTRAVENOUS | Status: AC
Start: 2020-08-16 — End: 2020-08-16

## 2020-08-16 MED ORDER — LACTATED RINGERS IV SOLN: INTRAVENOUS | Status: DC

## 2020-08-16 MED ORDER — DIPHENHYDRAMINE HCL 25 MG PO CAPS
25.0000 mg | ORAL_CAPSULE | Freq: Once | ORAL | Status: AC
Start: 2020-08-16 — End: 2020-08-16
  Administered 2020-08-16: 25 mg via ORAL
  Filled 2020-08-16: qty 1

## 2020-08-16 MED ORDER — ACETAMINOPHEN 325 MG PO TABS: 650.0000 mg | ORAL_TABLET | Freq: Once | ORAL | Status: DC

## 2020-08-16 MED ORDER — LACTATED RINGERS IV BOLUS
500.0000 mL | Freq: Once | INTRAVENOUS | Status: AC
  Administered 2020-08-16: 500 mL via INTRAVENOUS

## 2020-08-16 NOTE — Progress Notes (Signed)
Patients urine output was low, no orders for LR infusion after pit was finished. Called midwife on call about decreased urine output and got new order for 500 ml fluid bolus and LR infusion 125 hr after bolus is done. Will continue to monitor urine output for increase.

## 2020-08-16 NOTE — Progress Notes (Signed)
Critical lab hemoglobin 6.8, notified [place].

## 2020-08-16 NOTE — Anesthesia Postprocedure Evaluation (Signed)
Anesthesia Post Note  Patient: Caroline Yang  Procedure(s) Performed: CESAREAN SECTION WITH BILATERAL TUBAL LIGATION APPLICATION OF CELL SAVER     Patient location during evaluation: Mother Baby Anesthesia Type: Spinal Level of consciousness: oriented and awake and alert Pain management: pain level controlled Vital Signs Assessment: post-procedure vital signs reviewed and stable Respiratory status: spontaneous breathing and respiratory function stable Cardiovascular status: blood pressure returned to baseline and stable Postop Assessment: no headache, no backache, no apparent nausea or vomiting and able to ambulate Anesthetic complications: no   No notable events documented.  Last Vitals:  Vitals:   08/15/20 1946 08/15/20 2319  BP: 106/68 (!) 98/48  Pulse: 77 66  Resp: 19 18  Temp: 37 C 36.9 C  SpO2: 100% 100%    Last Pain:  Vitals:   08/15/20 2319  TempSrc: Oral  PainSc:                  March Rummage Farin Buhman

## 2020-08-17 LAB — BPAM RBC
Blood Product Expiration Date: 202206292359
Blood Product Expiration Date: 202206302359
ISSUE DATE / TIME: 202206130650
ISSUE DATE / TIME: 202206131023
Unit Type and Rh: 6200
Unit Type and Rh: 6200

## 2020-08-17 LAB — SURGICAL PATHOLOGY

## 2020-08-17 LAB — TYPE AND SCREEN
ABO/RH(D): A POS
Antibody Screen: NEGATIVE
Unit division: 0
Unit division: 0

## 2020-08-17 LAB — GLUCOSE, CAPILLARY: Glucose-Capillary: 107 mg/dL — ABNORMAL HIGH (ref 70–99)

## 2020-08-17 MED ORDER — IBUPROFEN 600 MG PO TABS: 600.0000 mg | ORAL_TABLET | Freq: Four times a day (QID) | ORAL | 0 refills | Status: AC

## 2020-08-17 MED ORDER — OXYCODONE HCL 5 MG PO TABS: 5.0000 mg | ORAL_TABLET | ORAL | 0 refills | Status: AC | PRN

## 2020-08-17 MED ORDER — ACETAMINOPHEN 500 MG PO TABS: 1000.0000 mg | ORAL_TABLET | Freq: Four times a day (QID) | ORAL | 0 refills | Status: AC

## 2020-08-17 NOTE — Discharge Summary (Signed)
Postpartum Discharge Summary  Date of Service updated 08/17/20    Patient Name: Caroline Yang DOB: [birth year] MRN: [id number]  Date of admission: 08/15/2020 Delivery date:08/15/2020  Delivering provider: Waymon Amato  Date of discharge: 08/17/2020  Admitting diagnosis: Encounter for maternal care for low transverse scar from repeat cesarean delivery [O34.211] Cesarean delivery delivered [O82] Intrauterine pregnancy: [redacted]w[redacted]d    Secondary diagnosis:  Active Problems:   Encounter for maternal care for low transverse scar from repeat cesarean delivery   S/P tubal ligation   Normal postpartum course   Iron deficiency anemia   Cesarean delivery delivered  Additional problems: none    Discharge diagnosis: Term Pregnancy Delivered                                              Post partum procedures: iron infusion Augmentation: N/A Complications: None  Hospital course: Sceduled C/S   40y.o. yo [id number] admitted to the hospital 08/15/2020 for scheduled cesarean section with the following indication:Elective Repeat.Delivery details are as follows:  Membrane Rupture Time/Date: 12:57 PM ,08/15/2020   Delivery Method:C-Section, Low Transverse  Details of operation can be found in separate operative note.  Patient had an uncomplicated postpartum course.  She is ambulating, tolerating a regular diet, passing flatus, and urinating well. Patient is discharged home in stable condition on  08/17/20        Newborn Data: Birth date:08/15/2020  Birth time:12:58 PM  Gender:Female  Living status:Living  Apgars:8 ,9  Weight:3530 g     Magnesium Sulfate received: No BMZ received: No Rhophylac:N/A MMR:N/A Transfusion:Yes 2 units PRBC  Physical exam  Vitals:   08/16/20 1313 08/16/20 1435 08/16/20 2201 08/17/20 0521  BP: 104/65 106/66 126/79 103/64  Pulse: 62 66 67 70  Resp: 16 16 18 18   Temp: 98.6 F (37 C) 98.5 F (36.9 C) 98.3 F (36.8 C) 98.4 F (36.9 C)  TempSrc: Oral  Oral Oral Oral  SpO2: 100%  100% 100%  Weight:      Height:       General: alert, cooperative, and no distress Lochia: appropriate Uterine Fundus: firm Incision: serosanguinous drainage on 50% of dressing, dressing to be replaced prior to discharge.  DVT Evaluation: No evidence of DVT seen on physical exam. No cords or calf tenderness. No significant calf/ankle edema. Labs: Lab Results  Component Value Date   WBC 10.8 (H) 08/16/2020   HGB 9.4 (L) 08/16/2020   HCT 30.8 (L) 08/16/2020   MCV 75.5 (L) 08/16/2020   PLT 307 08/16/2020   CMP Latest Ref Rng & Units 08/13/2020  Glucose 70 - 99 mg/dL 90  BUN 6 - 20 mg/dL 9  Creatinine 0.44 - 1.00 mg/dL 0.35(L)  Sodium 135 - 145 mmol/L 136  Potassium 3.5 - 5.1 mmol/L 4.3  Chloride 98 - 111 mmol/L 104  CO2 22 - 32 mmol/L 22  Calcium 8.9 - 10.3 mg/dL 9.0  Total Protein 6.5 - 8.1 g/dL -  Total Bilirubin 0.3 - 1.2 mg/dL -  Alkaline Phos 38 - 126 U/L -  AST 15 - 41 U/L -  ALT 0 - 44 U/L -   Edinburgh Score: Edinburgh Postnatal Depression Scale Screening Tool 08/16/2020  I have been able to laugh and see the funny side of things. 0  I have looked forward with enjoyment to things. 0  I have blamed myself unnecessarily when things went wrong. 0  I have been anxious or worried for no good reason. 0  I have felt scared or panicky for no good reason. 0  Things have been getting on top of me. 1  I have been so unhappy that I have had difficulty sleeping. 2  I have felt sad or miserable. 0  I have been so unhappy that I have been crying. 0  The thought of harming myself has occurred to me. 0  Edinburgh Postnatal Depression Scale Total 3      After visit meds:  Allergies as of 08/17/2020       Reactions   Penicillins Itching        Medication List     STOP taking these medications    ondansetron 4 MG disintegrating tablet Commonly known as: Zofran ODT       TAKE these medications    acetaminophen 500 MG tablet Commonly  known as: TYLENOL Take 2 tablets (1,000 mg total) by mouth every 6 (six) hours. What changed:  how much to take when to take this reasons to take this   ferrous sulfate 325 (65 FE) MG tablet Take 325 mg by mouth at bedtime.   ibuprofen 600 MG tablet Commonly known as: ADVIL Take 1 tablet (600 mg total) by mouth every 6 (six) hours.   oxyCODONE 5 MG immediate release tablet Commonly known as: Oxy IR/ROXICODONE Take 1-2 tablets (5-10 mg total) by mouth every 4 (four) hours as needed for moderate pain.   pantoprazole 20 MG tablet Commonly known as: PROTONIX Take 20 mg by mouth daily as needed for heartburn or indigestion.   Prenate Pixie 10-0.6-0.4-200 MG Caps Take 1 capsule by mouth at bedtime.               Discharge Care Instructions  (From admission, onward)           Start     Ordered   08/17/20 0000  Discharge wound care:       Comments: Your new dressing will be applied before you go home. Remove it in 5 days.   08/17/20 0916             Discharge home in stable condition Infant Feeding: Bottle and Breast Infant Disposition:home with mother Discharge instruction: per After Visit Summary and Postpartum booklet. Activity: Advance as tolerated. Pelvic rest for 6 weeks.  Diet: routine diet Anticipated Birth Control: BTL done PP Postpartum Appointment:6 weeks Additional Postpartum F/U:  none Future Appointments:No future appointments. Follow up Visit:  Follow-up Information     Waymon Amato, MD. Schedule an appointment as soon as possible for a visit in 6 week(s).   Specialty: Obstetrics and Gynecology Contact information: [address] [place] [address] [phone]                     08/17/2020 Arrie Eastern, CNM

## 2020-08-19 MED FILL — Heparin Sodium (Porcine) Inj 1000 Unit/ML: INTRAMUSCULAR | Qty: 30 | Status: AC

## 2020-08-19 MED FILL — Sodium Chloride IV Soln 0.9%: INTRAVENOUS | Qty: 1000 | Status: AC

## 2021-10-18 ENCOUNTER — Other Ambulatory Visit: Payer: Self-pay

## 2021-10-18 ENCOUNTER — Emergency Department (HOSPITAL_BASED_OUTPATIENT_CLINIC_OR_DEPARTMENT_OTHER): Payer: Medicaid Other | Admitting: Radiology

## 2021-10-18 ENCOUNTER — Encounter (HOSPITAL_BASED_OUTPATIENT_CLINIC_OR_DEPARTMENT_OTHER): Payer: Self-pay | Admitting: Emergency Medicine

## 2021-10-18 DIAGNOSIS — Z20822 Contact with and (suspected) exposure to covid-19: Secondary | ICD-10-CM | POA: Insufficient documentation

## 2021-10-18 DIAGNOSIS — R0602 Shortness of breath: Secondary | ICD-10-CM | POA: Insufficient documentation

## 2021-10-18 DIAGNOSIS — J029 Acute pharyngitis, unspecified: Secondary | ICD-10-CM | POA: Insufficient documentation

## 2021-10-18 DIAGNOSIS — R0982 Postnasal drip: Secondary | ICD-10-CM | POA: Insufficient documentation

## 2021-10-18 LAB — SARS CORONAVIRUS 2 BY RT PCR: SARS Coronavirus 2 by RT PCR: NEGATIVE

## 2021-10-18 LAB — GROUP A STREP BY PCR: Group A Strep by PCR: NOT DETECTED

## 2021-10-18 NOTE — ED Triage Notes (Signed)
  Patient comes in with sore throat and SOB that has been going on for 3 days.  Husband states patient has not felt good and endorses dysphagia.  No fever at home.  Son was sick last week with similar symptoms.  Also has ear pain and SOB at night when trying to sleep.  Pain 6/10, tender/sore throat.

## 2021-10-19 ENCOUNTER — Emergency Department (HOSPITAL_BASED_OUTPATIENT_CLINIC_OR_DEPARTMENT_OTHER)
Admission: EM | Admit: 2021-10-19 | Discharge: 2021-10-19 | Disposition: A | Discharge status: Home / Self Care | Payer: Medicaid Other | Attending: Emergency Medicine | Admitting: Emergency Medicine

## 2021-10-19 DIAGNOSIS — J029 Acute pharyngitis, unspecified: Secondary | ICD-10-CM

## 2021-10-19 DIAGNOSIS — R0982 Postnasal drip: Secondary | ICD-10-CM

## 2021-10-19 NOTE — ED Notes (Signed)
Pt verbalizes understanding of discharge instructions. Opportunity for questioning and answers were provided. Pt discharged from ED to home with husband.    

## 2021-10-19 NOTE — ED Provider Notes (Signed)
[place] EMER[facility] DEPT Provider Note  CSN: [id number] Arrival date & time: 10/18/21 2302  Chief Complaint(s) Sore Throat and Shortness of Breath  HPI Caroline Yang is a 41 y.o. female     Sore Throat This is a new problem. Episode onset: 3 days. The problem occurs constantly (mostly at night). Associated symptoms include shortness of breath. Pertinent negatives include no chest pain, no abdominal pain and no headaches. Exacerbated by: lying down. Nothing relieves the symptoms. She has tried nothing for the symptoms.  Shortness of Breath Severity:  Mild (when lying supine) Context: URI   Associated symptoms: no abdominal pain, no chest pain, no cough, no fever, no headaches, no sputum production, no vomiting and no wheezing     Past Medical History Past Medical History:  Diagnosis Date   Complication of anesthesia    itching   GERD (gastroesophageal reflux disease)    occasionally and if needs meds uses OTC   Gestational diabetes    Patient Active Problem List   Diagnosis Date Noted   Encounter for maternal care for low transverse scar from repeat cesarean delivery 08/15/2020   S/P tubal ligation 08/15/2020   Normal postpartum course 08/15/2020   Iron deficiency anemia 08/15/2020   Cesarean delivery delivered 08/15/2020   IUGR (intrauterine growth restriction) 07/08/2014   Oligohydramnios 07/08/2014   Positive GBS test 07/08/2014   Home Medication(s) Prior to Admission medications   Medication Sig Start Date End Date Taking? Authorizing Provider  acetaminophen (TYLENOL) 500 MG tablet Take 2 tablets (1,000 mg total) by mouth every 6 (six) hours. 08/17/20   Arrie Eastern, CNM  ferrous sulfate 325 (65 FE) MG tablet Take 325 mg by mouth at bedtime.    [provider]  ibuprofen (ADVIL) 600 MG tablet Take 1 tablet (600 mg total) by mouth every 6 (six) hours. 08/17/20   Arrie Eastern, CNM  oxyCODONE (OXY IR/ROXICODONE) 5 MG immediate release  tablet Take 1-2 tablets (5-10 mg total) by mouth every 4 (four) hours as needed for moderate pain. 08/17/20   Arrie Eastern, CNM  pantoprazole (PROTONIX) 20 MG tablet Take 20 mg by mouth daily as needed for heartburn or indigestion.    [provider]  Prenat-FeAsp-Meth-FA-DHA w/o A (PRENATE PIXIE) 10-0.6-0.4-200 MG CAPS Take 1 capsule by mouth at bedtime. 06/29/20   [provider]                                                                                                                                    Allergies Penicillins  Review of Systems Review of Systems  Constitutional:  Negative for fever.  Respiratory:  Positive for shortness of breath. Negative for cough, sputum production and wheezing.   Cardiovascular:  Negative for chest pain.  Gastrointestinal:  Negative for abdominal pain and vomiting.  Neurological:  Negative for headaches.   As noted in HPI  Physical Exam Vital Signs  I have reviewed the triage vital signs BP (!) 153/92 (BP Location: Right Arm)   Pulse 89   Temp 98.3 F (36.8 C)   Resp 18   Ht '5\' 1"'$  (1.549 m)   Wt 90.7 kg   SpO2 100%   BMI 37.79 kg/m   Physical Exam Vitals reviewed.  Constitutional:      General: She is not in acute distress.    Appearance: She is well-developed. She is not diaphoretic.  HENT:     Head: Normocephalic and atraumatic.     Nose: Mucosal edema and congestion present.     Mouth/Throat:     Lips: No lesions.     Tongue: No lesions.     Pharynx: No pharyngeal swelling, oropharyngeal exudate or posterior oropharyngeal erythema.     Tonsils: No tonsillar exudate.     Comments: Post nasal drip with cobblestoning Eyes:     General: No scleral icterus.       Right eye: No discharge.        Left eye: No discharge.     Conjunctiva/sclera: Conjunctivae normal.     Pupils: Pupils are equal, round, and reactive to light.  Cardiovascular:     Rate and Rhythm: Normal rate and regular rhythm.     Heart  sounds: No murmur heard.    No friction rub. No gallop.  Pulmonary:     Effort: Pulmonary effort is normal. No respiratory distress.     Breath sounds: Normal breath sounds. No stridor. No rales.  Abdominal:     General: There is no distension.     Palpations: Abdomen is soft.     Tenderness: There is no abdominal tenderness.  Musculoskeletal:        General: No tenderness.     Cervical back: Normal range of motion and neck supple.  Skin:    General: Skin is warm and dry.     Findings: No erythema or rash.  Neurological:     Mental Status: She is alert and oriented to person, place, and time.     ED Results and Treatments Labs (all labs ordered are listed, but only abnormal results are displayed) Labs Reviewed  SARS CORONAVIRUS 2 BY RT PCR  GROUP A STREP BY PCR                                                                                                                         EKG  EKG Interpretation  Date/Time:    Ventricular Rate:    PR Interval:    QRS Duration:   QT Interval:    QTC Calculation:   R Axis:     Text Interpretation:         Radiology DG Chest 2 View  Result Date: 10/18/2021 CLINICAL DATA:  Shortness of breath. Sore throat and shortness of breath for 3 days. EXAM: CHEST - 2 VIEW COMPARISON:  None Available. FINDINGS: The heart size and mediastinal contours are within normal limits.  Both lungs are clear. The visualized skeletal structures are unremarkable. IMPRESSION: No active cardiopulmonary disease. Electronically Signed   By: Keane Police D.O.   On: 10/18/2021 23:30    Medications Ordered in ED Medications - No data to display                                                                                                                                   Procedures Procedures  (including critical care time)  Medical Decision Making / ED Course   Medical Decision Making Amount and/or Complexity of Data Reviewed Labs: ordered.  Decision-making details documented in ED Course. Radiology: ordered and independent interpretation performed. Decision-making details documented in ED Course.    Patient appears well. No signs of toxicity, patient is interactive and playful. No hypoxia, tachypnea or other signs of respiratory distress. No sign of clinical dehydration. Lung exam clear. Rest of exam as above.  COVID-negative.  Rapid strep negative. Chest x-ray without evidence of pneumonia.  Most consistent with viral upper respiratory infection vs allergic rhinitis   Discussed symptomatic treatment with the parents and they will follow closely with their PCP.         Final Clinical Impression(s) / ED Diagnoses Final diagnoses:  Postnasal drip  Sore throat   The patient appears reasonably screened and/or stabilized for discharge and I doubt any other medical condition or other [facility] requiring further screening, evaluation, or treatment in the ED at this time. I have discussed the findings, Dx and Tx plan with the patient/family who expressed understanding and agree(s) with the plan. Discharge instructions discussed at length. The patient/family was given strict return precautions who verbalized understanding of the instructions. No further questions at time of discharge.  Disposition: Discharge  Condition: Good  ED Discharge Orders     None        Follow Up: Primary care provider  Call  to schedule an appointment for close follow up           This chart was dictated using voice recognition software.  Despite best efforts to proofread,  errors can occur which can change the documentation meaning.    Fatima Blank, MD 10/19/21 [phone]

## 2021-12-11 ENCOUNTER — Emergency Department (HOSPITAL_BASED_OUTPATIENT_CLINIC_OR_DEPARTMENT_OTHER)
Admission: EM | Admit: 2021-12-11 | Discharge: 2021-12-12 | Disposition: A | Discharge status: Home / Self Care | Payer: Medicaid Other | Attending: Emergency Medicine | Admitting: Emergency Medicine

## 2021-12-11 ENCOUNTER — Encounter (HOSPITAL_BASED_OUTPATIENT_CLINIC_OR_DEPARTMENT_OTHER): Payer: Self-pay

## 2021-12-11 DIAGNOSIS — R52 Pain, unspecified: Secondary | ICD-10-CM

## 2021-12-11 DIAGNOSIS — M79605 Pain in left leg: Secondary | ICD-10-CM

## 2021-12-11 NOTE — ED Triage Notes (Signed)
Pt presents to the ED with left leg pain that started two days ago. Reports swelling. No injury or trauma. No warmth or redness. No recent travel.

## 2021-12-12 ENCOUNTER — Ambulatory Visit (HOSPITAL_BASED_OUTPATIENT_CLINIC_OR_DEPARTMENT_OTHER)
Admission: RE | Admit: 2021-12-12 | Discharge: 2021-12-12 | Disposition: A | Discharge status: Home / Self Care | Payer: Medicaid Other | Source: Ambulatory Visit | Attending: Emergency Medicine | Admitting: Emergency Medicine

## 2021-12-12 DIAGNOSIS — R52 Pain, unspecified: Secondary | ICD-10-CM | POA: Insufficient documentation

## 2021-12-12 LAB — CBC WITH DIFFERENTIAL/PLATELET
Abs Immature Granulocytes: 0.04 10*3/uL (ref 0.00–0.07)
Basophils Absolute: 0 10*3/uL (ref 0.0–0.1)
Basophils Relative: 0 %
Eosinophils Absolute: 0.2 10*3/uL (ref 0.0–0.5)
Eosinophils Relative: 3 %
HCT: 35.3 % — ABNORMAL LOW (ref 36.0–46.0)
Hemoglobin: 11.5 g/dL — ABNORMAL LOW (ref 12.0–15.0)
Immature Granulocytes: 1 %
Lymphocytes Relative: 40 %
Lymphs Abs: 2.7 10*3/uL (ref 0.7–4.0)
MCH: 26.9 pg (ref 26.0–34.0)
MCHC: 32.6 g/dL (ref 30.0–36.0)
MCV: 82.7 fL (ref 80.0–100.0)
Monocytes Absolute: 0.3 10*3/uL (ref 0.1–1.0)
Monocytes Relative: 5 %
Neutro Abs: 3.5 10*3/uL (ref 1.7–7.7)
Neutrophils Relative %: 51 %
Platelets: 335 10*3/uL (ref 150–400)
RBC: 4.27 MIL/uL (ref 3.87–5.11)
RDW: 15.3 % (ref 11.5–15.5)
WBC: 6.8 10*3/uL (ref 4.0–10.5)
nRBC: 0 % (ref 0.0–0.2)

## 2021-12-12 LAB — BASIC METABOLIC PANEL
Anion gap: 8 (ref 5–15)
BUN: 11 mg/dL (ref 6–20)
CO2: 28 mmol/L (ref 22–32)
Calcium: 9.7 mg/dL (ref 8.9–10.3)
Chloride: 105 mmol/L (ref 98–111)
Creatinine, Ser: 0.66 mg/dL (ref 0.44–1.00)
GFR, Estimated: >60 mL/min (ref >60)
Glucose, Bld: 94 mg/dL (ref 70–99)
Potassium: 3.8 mmol/L (ref 3.5–5.1)
Sodium: 141 mmol/L (ref 135–145)

## 2021-12-12 MED ORDER — ENOXAPARIN SODIUM 100 MG/ML IJ SOSY
1.0000 mg/kg | PREFILLED_SYRINGE | Freq: Once | INTRAMUSCULAR | Status: AC
  Administered 2021-12-12: 100 mg via SUBCUTANEOUS
  Filled 2021-12-12: qty 1

## 2021-12-12 MED ORDER — ACETAMINOPHEN 500 MG PO TABS
1000.0000 mg | ORAL_TABLET | Freq: Four times a day (QID) | ORAL | Status: DC | PRN
  Administered 2021-12-12: 1000 mg via ORAL
  Filled 2021-12-12: qty 2

## 2021-12-12 NOTE — ED Notes (Signed)
Pt c/o Left leg pain, no trauma, swelling or rect travel

## 2021-12-12 NOTE — ED Provider Notes (Signed)
[place] EMERGENCY DEPT Provider Note   CSN: [id number] Arrival date & time: 12/11/21  2254     History  Chief Complaint  Patient presents with   Leg Pain    Caroline Yang is a 41 y.o. female.  HPI     This is a 41 year old female who presents with left leg pain.  Patient reports 2-day history of worsening left leg pain and swelling.  It is worse with ambulation.  Denies trauma.  Denies any fevers.  Reports pain in the upper calf and knee.  No recent travel.  No history of blood clots.  Has not taken anything for her pain.  Home Medications Prior to Admission medications   Medication Sig Start Date End Date Taking? Authorizing Provider  acetaminophen (TYLENOL) 500 MG tablet Take 2 tablets (1,000 mg total) by mouth every 6 (six) hours. 08/17/20   Arrie Eastern, CNM  ferrous sulfate 325 (65 FE) MG tablet Take 325 mg by mouth at bedtime.    [provider]  ibuprofen (ADVIL) 600 MG tablet Take 1 tablet (600 mg total) by mouth every 6 (six) hours. 08/17/20   Arrie Eastern, CNM  oxyCODONE (OXY IR/ROXICODONE) 5 MG immediate release tablet Take 1-2 tablets (5-10 mg total) by mouth every 4 (four) hours as needed for moderate pain. 08/17/20   Arrie Eastern, CNM  pantoprazole (PROTONIX) 20 MG tablet Take 20 mg by mouth daily as needed for heartburn or indigestion.    [provider]  Prenat-FeAsp-Meth-FA-DHA w/o A (PRENATE PIXIE) 10-0.6-0.4-200 MG CAPS Take 1 capsule by mouth at bedtime. 06/29/20   [provider]      Allergies    Penicillins    Review of Systems   Review of Systems  Constitutional:  Negative for fever.  Cardiovascular:  Positive for leg swelling.  Musculoskeletal:        Leg pain and swelling  All other systems reviewed and are negative.   Physical Exam Updated Vital Signs BP (!) 137/59   Pulse 68   Temp 97.9 F (36.6 C) (Oral)   Resp 18   Ht 1.549 m ('5\' 1"'$ )   Wt 98.9 kg   SpO2 100%   BMI 41.19 kg/m   Physical Exam Vitals and nursing note reviewed.  Constitutional:      Appearance: She is well-developed. She is obese. She is not ill-appearing.  HENT:     Head: Normocephalic and atraumatic.  Eyes:     Pupils: Pupils are equal, round, and reactive to light.  Cardiovascular:     Rate and Rhythm: Normal rate and regular rhythm.  Pulmonary:     Effort: Pulmonary effort is normal. No respiratory distress.  Abdominal:     Palpations: Abdomen is soft.  Musculoskeletal:     Cervical back: Neck supple.     Comments: Focused examination of the left lower extremity with tenderness palpation of the calf, no obvious swelling, compartments soft, no overlying skin changes, normal range of motion of the left knee, no erythema, no deformities, 2+ DP pulse  Skin:    General: Skin is warm and dry.  Neurological:     Mental Status: She is alert and oriented to person, place, and time.  Psychiatric:        Mood and Affect: Mood normal.     ED Results / Procedures / Treatments   Labs (all labs ordered are listed, but only abnormal results are displayed) Labs Reviewed  CBC WITH DIFFERENTIAL/PLATELET - Abnormal;  Notable for the following components:      Result Value   Hemoglobin 11.5 (*)    HCT 35.3 (*)    All other components within normal limits  BASIC METABOLIC PANEL    EKG None  Radiology No results found.  Procedures Procedures    Medications Ordered in ED Medications  enoxaparin (LOVENOX) injection 100 mg (has no administration in time range)  acetaminophen (TYLENOL) tablet 1,000 mg (has no administration in time range)    ED Course/ Medical Decision Making/ A&P                           Medical Decision Making Amount and/or Complexity of Data Reviewed Labs: ordered.  Risk OTC drugs. Prescription drug management.   This patient presents to the ED for concern of left leg pain, this involves an extensive number of treatment options, and is a complaint that carries  with it a high risk of complications and morbidity.  I considered the following differential and admission for this acute, potentially life threatening condition.  The differential diagnosis includes DVT, arthritis, muscle strain  MDM:    This is a 41 year old female who presents with atraumatic left leg pain.  She is nontoxic and vital signs are notable for blood pressure 137/59.  She has some tenderness to the left calf.  No overlying skin changes to suggest cellulitis or infection.  Low suspicion for septic arthritis.  Normal range of motion of the knee.  Feel that x-rays would be low yield given atraumatic history.  Patient does not have any recent lab work.  Obtain lab work to assess for platelets and renal function.  This was in order to be able to safely give her Lovenox.  Do not have access to ultrasound at this time but will have her return for DVT imaging.  In the meantime, recommend Tylenol.  (Labs, imaging, consults)  Labs: I Ordered, and personally interpreted labs.  The pertinent results include: CBC, BMP  Imaging Studies ordered: I ordered imaging studies including return for ultrasound I independently visualized and interpreted imaging. I agree with the radiologist interpretation  Additional history obtained from husband at bedside.  External records from outside source obtained and reviewed including prior labs  Cardiac Monitoring: The patient was maintained on a cardiac monitor.  I personally viewed and interpreted the cardiac monitored which showed an underlying rhythm of: Normal sinus rhythm  Reevaluation: After the interventions noted above, I reevaluated the patient and found that they have :stayed the same  Social Determinants of Health: Lives independently  Disposition: Discharge  Co morbidities that complicate the patient evaluation  Past Medical History:  Diagnosis Date   Complication of anesthesia    itching   GERD (gastroesophageal reflux disease)     occasionally and if needs meds uses OTC   Gestational diabetes      Medicines Meds ordered this encounter  Medications   enoxaparin (LOVENOX) injection 100 mg   acetaminophen (TYLENOL) tablet 1,000 mg    I have reviewed the patients home medicines and have made adjustments as needed  Problem List / ED Course: Problem List Items Addressed This Visit   None Visit Diagnoses     Left leg pain    -  Primary                   Final Clinical Impression(s) / ED Diagnoses Final diagnoses:  Left leg pain    Rx /  DC Orders ED Discharge Orders          Ordered    US Venous Img Lower Unilateral Left        12/12/21 0109              Merryl Hacker, MD 12/12/21 [phone]

## 2021-12-12 NOTE — ED Provider Notes (Signed)
Patient returns emergency room today for her scheduled outpatient venous ultrasound of her left lower leg to evaluate for DVT.  Study is read as negative for acute DVT.  Advised to follow-up with her primary care provider.   Tacy Learn, PA-C 12/12/21 1221    Audley Hose, MD 12/13/21 2117

## 2021-12-12 NOTE — Discharge Instructions (Signed)
You were seen today for leg pain.  Take Tylenol as needed for pain.  Return for ultrasound imaging later today.

## 2021-12-26 ENCOUNTER — Ambulatory Visit
Admission: RE | Admit: 2021-12-26 | Discharge: 2021-12-26 | Disposition: A | Discharge status: Home / Self Care | Payer: Commercial Managed Care - HMO | Source: Ambulatory Visit | Attending: Family Medicine | Admitting: Family Medicine

## 2021-12-26 ENCOUNTER — Other Ambulatory Visit: Payer: Self-pay | Admitting: Family Medicine

## 2021-12-26 DIAGNOSIS — M25562 Pain in left knee: Secondary | ICD-10-CM

## 2023-11-20 ENCOUNTER — Emergency Department (HOSPITAL_BASED_OUTPATIENT_CLINIC_OR_DEPARTMENT_OTHER)
Admission: EM | Admit: 2023-11-20 | Discharge: 2023-11-21 | Disposition: A | Discharge status: Home / Self Care | Attending: Emergency Medicine | Admitting: Emergency Medicine

## 2023-11-20 ENCOUNTER — Encounter (HOSPITAL_BASED_OUTPATIENT_CLINIC_OR_DEPARTMENT_OTHER): Payer: Self-pay

## 2023-11-20 ENCOUNTER — Other Ambulatory Visit: Payer: Self-pay

## 2023-11-20 DIAGNOSIS — M79662 Pain in left lower leg: Secondary | ICD-10-CM | POA: Diagnosis not present

## 2023-11-20 DIAGNOSIS — M79605 Pain in left leg: Secondary | ICD-10-CM | POA: Diagnosis present

## 2023-11-20 NOTE — ED Triage Notes (Signed)
 Pt is coming in for left leg pain that she feels in her left thigh, knee and lower leg. She describes it as non-traumatic, with no know injury to the are. She also mentions that she has not been on any recent long car trips or plane rides. She is otherwise stable with no chest pain or shortness of breath and was ambulatory into the triage room.

## 2023-11-21 ENCOUNTER — Ambulatory Visit: Payer: Self-pay | Admitting: *Deleted

## 2023-11-21 NOTE — Discharge Instructions (Signed)
 In addition to your prescribed meloxicam, you can take Acetaminophen  (Tylenol ), topical muscle creams such as SalonPas, [employer], Bengay, etc. Please stretch, apply ice or heat (whichever helps),use red light therapy, and have massage therapy for additional assistance.  For pain control you may take 1000 mg of acetaminophen  (Tylenol ) every 8 hours as needed.  Please limit 24 hr usage of the following medicines: - Acetaminophen  (Tylenol ) to 4000 mg

## 2023-11-21 NOTE — Telephone Encounter (Signed)
 FYI Only or Action Required?: Action required by provider: request for appointment and new patient appt .  Patient was last seen in primary care on na.  Called Nurse Triage reporting Foot Pain.  Symptoms began a week ago.  Interventions attempted: Prescription medications: meloxicam  and Rest, hydration, or home remedies.  Symptoms are: gradually worsening.  Triage Disposition: See PCP When Office is Open (Within 3 Days)      Patient/caregiver understands and will follow disposition?: No, wishes to speak with PCP   New patient appt 12/20/23   Copied from CRM #[id number]. Topic: Clinical - Red Word Triage >> Nov 21, 2023  4:17 PM [place] J wrote: [facility] that prompted transfer to Nurse Triage: Patient has been having pain on her heel. Patient's husband is needing to establish care with a female provider. Reason for Disposition  [1] MODERATE pain (e.g., interferes with normal activities, limping) AND [2] present > 3 days  Answer Assessment - Initial Assessment Questions Recommended to pt husband UC. HIPAA precautions followed. New patient appt established at [facility] 12/20/23.      1. ONSET: When did the pain start?      1 week ago per husband not on DPR and not with patient now  2. LOCATION: Where is the pain located?      Right heel  3. PAIN: How bad is the pain?    (Scale 1-10; or mild, moderate, severe)     Sharp pain right heel , did not rate pain now severity comes and goes 4. WORK OR EXERCISE: Has there been any recent work or exercise that involved this part of the body?      na 5. CAUSE: What do you think is causing the foot pain?     Not sure  6. OTHER SYMPTOMS: Do you have any other symptoms? (e.g., leg pain, rash, fever, numbness)     Right heel pain , numbness tingling hands fingers at times. Taking meloxicam and not effective  7. PREGNANCY: Is there any chance you are pregnant? When was your last menstrual period?     na  Protocols used: Foot  Pain-A-AH

## 2023-11-21 NOTE — ED Notes (Signed)
 ED Provider at bedside.

## 2023-11-21 NOTE — ED Provider Notes (Signed)
 [place] EMERGENCY DEPARTMENT AT [facility] Provider Note  CSN: [id number] Arrival date & time: 11/20/23 2138  Chief Complaint(s) Leg Pain  HPI Caroline Yang is a 43 y.o. female with a past medical history listed below here for left leg pain.  Patient denies any fall or trauma.  Worse with palpation of the anterior lateral aspect of the left leg.  Patient also having pain in the heel.  No swelling.  No prolonged immobilization.  Patient has had similar pain in the past and had a negative DVT study.  Patient reports that she started a new job recently has been walking more than usual.  Patient has been seen by urgent care and orthopedic surgery for the same.  Has been prescribed meloxicam for pain which did not provide much relief this time around.  The history is provided by the patient.    Past Medical History Past Medical History:  Diagnosis Date   Complication of anesthesia    itching   GERD (gastroesophageal reflux disease)    occasionally and if needs meds uses OTC   Gestational diabetes    Patient Active Problem List   Diagnosis Date Noted   Encounter for maternal care for low transverse scar from repeat cesarean delivery 08/15/2020   S/P tubal ligation 08/15/2020   Normal postpartum course 08/15/2020   Iron deficiency anemia 08/15/2020   Cesarean delivery delivered 08/15/2020   IUGR (intrauterine growth restriction) 07/08/2014   Oligohydramnios 07/08/2014   Positive GBS test 07/08/2014   Home Medication(s) Prior to Admission medications   Medication Sig Start Date End Date Taking? Authorizing Provider  acetaminophen  (TYLENOL ) 500 MG tablet Take 2 tablets (1,000 mg total) by mouth every 6 (six) hours. 08/17/20   Grice, Vivian B, CNM  ferrous sulfate  325 (65 FE) MG tablet Take 325 mg by mouth at bedtime.    [provider]  ibuprofen  (ADVIL ) 600 MG tablet Take 1 tablet (600 mg total) by mouth every 6 (six) hours. 08/17/20   Grice, Vivian B, CNM   oxyCODONE  (OXY IR/ROXICODONE ) 5 MG immediate release tablet Take 1-2 tablets (5-10 mg total) by mouth every 4 (four) hours as needed for moderate pain. 08/17/20   Grice, Vivian B, CNM  pantoprazole  (PROTONIX ) 20 MG tablet Take 20 mg by mouth daily as needed for heartburn or indigestion.    [provider]  Prenat-FeAsp-Meth-FA-DHA w/o A (PRENATE PIXIE) 10-0.6-0.4-200 MG CAPS Take 1 capsule by mouth at bedtime. 06/29/20   [provider]                                                                                                                                    Allergies Penicillins  Review of Systems Review of Systems As noted in HPI  Physical Exam Vital Signs  I have reviewed the triage vital signs BP (!) 149/95 (BP Location: Right Arm)   Pulse 89   Temp 98.4  F (36.9 C) (Oral)   Resp 14   SpO2 100%   Physical Exam Vitals reviewed.  Constitutional:      General: She is not in acute distress.    Appearance: She is well-developed. She is not diaphoretic.  HENT:     Head: Normocephalic and atraumatic.     Right Ear: External ear normal.     Left Ear: External ear normal.     Nose: Nose normal.  Eyes:     General: No scleral icterus.    Conjunctiva/sclera: Conjunctivae normal.  Neck:     Trachea: Phonation normal.  Cardiovascular:     Rate and Rhythm: Normal rate and regular rhythm.  Pulmonary:     Effort: Pulmonary effort is normal. No respiratory distress.     Breath sounds: No stridor.  Abdominal:     General: There is no distension.  Musculoskeletal:        General: Normal range of motion.     Cervical back: Normal range of motion.     Right lower leg: No swelling, deformity, lacerations, tenderness or bony tenderness. No edema.     Left lower leg: Tenderness (to anterior and lateral muscles. no calf tenderness) present. No swelling, deformity, lacerations or bony tenderness. No edema.     Right ankle: No swelling. No tenderness. Normal range of  motion. Normal pulse.     Left ankle: No swelling. No tenderness. Normal range of motion. Normal pulse.  Neurological:     Mental Status: She is alert and oriented to person, place, and time.  Psychiatric:        Behavior: Behavior normal.     ED Results and Treatments Labs (all labs ordered are listed, but only abnormal results are displayed) Labs Reviewed - No data to display                                                                                                                       EKG  EKG Interpretation Date/Time:    Ventricular Rate:    PR Interval:    QRS Duration:    QT Interval:    QTC Calculation:   R Axis:      Text Interpretation:         Radiology No results found.  Medications Ordered in ED Medications - No data to display Procedures Procedures  (including critical care time) Medical Decision Making / ED Course   Medical Decision Making   Left leg pain differential diagnosis considered But no evidence of infectious process such as cellulitis or deep tissue infection. No trauma concerning for fracture. Exam not concerning for DVT Likely muscle strain of the above musculature.  Also considering shinsplints. Supportive management recommended.    Final Clinical Impression(s) / ED Diagnoses Final diagnoses:  Pain in left lower leg   The patient appears reasonably screened and/or stabilized for discharge and I doubt any other medical condition or other [facility] requiring further screening, evaluation, or treatment in the ED at  this time. I have discussed the findings, Dx and Tx plan with the patient/family who expressed understanding and agree(s) with the plan. Discharge instructions discussed at length. The patient/family was given strict return precautions who verbalized understanding of the instructions. No further questions at time of discharge.  Disposition: Discharge  Condition: Good  ED Discharge Orders     None        Follow  Up: Primary care provider  Schedule an appointment as soon as possible for a visit  if you do not have a primary care physician, contact [facility] at [phone] for referral  Orthopedic Surgery  Call  as needed     This chart was dictated using voice recognition software.  Despite best efforts to proofread,  errors can occur which can change the documentation meaning.    Trine Raynell Moder, MD 11/21/23 [phone]

## 2023-12-20 ENCOUNTER — Ambulatory Visit: Admitting: Family Medicine
